# Patient Record
Sex: Female | Born: 1950
Health system: Southern US, Community
[De-identification: ages and names within clinical notes are randomized; demographics above are authoritative.]

## PROBLEM LIST (undated history)

## (undated) DIAGNOSIS — K759 Inflammatory liver disease, unspecified: Secondary | ICD-10-CM

## (undated) DIAGNOSIS — K219 Gastro-esophageal reflux disease without esophagitis: Secondary | ICD-10-CM

## (undated) DIAGNOSIS — D649 Anemia, unspecified: Secondary | ICD-10-CM

## (undated) DIAGNOSIS — Z5189 Encounter for other specified aftercare: Secondary | ICD-10-CM

## (undated) DIAGNOSIS — I10 Essential (primary) hypertension: Secondary | ICD-10-CM

## (undated) HISTORY — DX: Essential (primary) hypertension: I10

## (undated) HISTORY — DX: Anemia, unspecified: D64.9

## (undated) HISTORY — DX: Gastro-esophageal reflux disease without esophagitis: K21.9

## (undated) HISTORY — DX: Encounter for other specified aftercare: Z51.89

## (undated) HISTORY — DX: Inflammatory liver disease, unspecified: K75.9

---
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
## 1998-07-18 ENCOUNTER — Other Ambulatory Visit: Admission: RE | Admit: 1998-07-18 | Discharge: 1998-07-18 | Payer: Self-pay | Admitting: Obstetrics and Gynecology

## 1999-10-03 ENCOUNTER — Other Ambulatory Visit: Admission: RE | Admit: 1999-10-03 | Discharge: 1999-10-03 | Payer: Self-pay | Admitting: Obstetrics and Gynecology

## 2000-05-05 ENCOUNTER — Other Ambulatory Visit: Admission: RE | Admit: 2000-05-05 | Discharge: 2000-05-05 | Payer: Self-pay | Admitting: Obstetrics and Gynecology

## 2002-03-16 ENCOUNTER — Other Ambulatory Visit: Admission: RE | Admit: 2002-03-16 | Discharge: 2002-03-16 | Payer: Self-pay | Admitting: Obstetrics and Gynecology

## 2003-05-10 ENCOUNTER — Other Ambulatory Visit: Admission: RE | Admit: 2003-05-10 | Discharge: 2003-05-10 | Payer: Self-pay | Admitting: Obstetrics and Gynecology

## 2003-12-21 ENCOUNTER — Other Ambulatory Visit: Admission: RE | Admit: 2003-12-21 | Discharge: 2003-12-21 | Payer: Self-pay | Admitting: Orthopaedic Surgery

## 2004-08-23 ENCOUNTER — Other Ambulatory Visit: Admission: RE | Admit: 2004-08-23 | Discharge: 2004-08-23 | Payer: Self-pay | Admitting: Obstetrics and Gynecology

## 2006-09-21 ENCOUNTER — Ambulatory Visit: Payer: Self-pay | Admitting: Internal Medicine

## 2006-09-25 ENCOUNTER — Ambulatory Visit: Payer: Self-pay | Admitting: Internal Medicine

## 2006-09-25 ENCOUNTER — Ambulatory Visit (HOSPITAL_COMMUNITY): Admission: RE | Admit: 2006-09-25 | Discharge: 2006-09-25 | Payer: Self-pay | Admitting: Internal Medicine

## 2006-09-25 HISTORY — PX: COLONOSCOPY: SHX174

## 2006-09-25 HISTORY — PX: UPPER GASTROINTESTINAL ENDOSCOPY: SHX188

## 2007-02-05 ENCOUNTER — Ambulatory Visit: Payer: Self-pay | Admitting: Gastroenterology

## 2007-03-19 ENCOUNTER — Ambulatory Visit (HOSPITAL_COMMUNITY): Admission: RE | Admit: 2007-03-19 | Discharge: 2007-03-19 | Payer: Self-pay | Admitting: Gastroenterology

## 2007-03-19 ENCOUNTER — Encounter (INDEPENDENT_AMBULATORY_CARE_PROVIDER_SITE_OTHER): Payer: Self-pay | Admitting: Specialist

## 2007-03-19 HISTORY — PX: LIVER BIOPSY: SHX301

## 2007-03-23 ENCOUNTER — Ambulatory Visit: Payer: Self-pay | Admitting: Gastroenterology

## 2007-06-22 ENCOUNTER — Ambulatory Visit: Payer: Self-pay | Admitting: Gastroenterology

## 2007-07-22 ENCOUNTER — Ambulatory Visit: Payer: Self-pay | Admitting: Gastroenterology

## 2007-07-29 ENCOUNTER — Ambulatory Visit: Payer: Self-pay | Admitting: Gastroenterology

## 2007-08-11 ENCOUNTER — Ambulatory Visit: Payer: Self-pay | Admitting: Gastroenterology

## 2007-08-26 ENCOUNTER — Ambulatory Visit: Payer: Self-pay | Admitting: Gastroenterology

## 2007-09-09 ENCOUNTER — Ambulatory Visit: Payer: Self-pay | Admitting: Gastroenterology

## 2007-10-07 ENCOUNTER — Ambulatory Visit: Payer: Self-pay | Admitting: Gastroenterology

## 2007-11-09 ENCOUNTER — Ambulatory Visit (HOSPITAL_COMMUNITY): Admission: RE | Admit: 2007-11-09 | Discharge: 2007-11-09 | Payer: Self-pay | Admitting: Gastroenterology

## 2007-12-14 ENCOUNTER — Ambulatory Visit: Payer: Self-pay | Admitting: Gastroenterology

## 2008-01-18 ENCOUNTER — Ambulatory Visit: Payer: Self-pay | Admitting: Gastroenterology

## 2008-02-24 ENCOUNTER — Ambulatory Visit: Payer: Self-pay | Admitting: Gastroenterology

## 2008-03-02 ENCOUNTER — Ambulatory Visit: Payer: Self-pay | Admitting: Gastroenterology

## 2008-03-02 ENCOUNTER — Ambulatory Visit: Payer: Self-pay | Admitting: Oncology

## 2008-03-09 LAB — CBC WITH DIFFERENTIAL (CANCER CENTER ONLY)
EOS%: 1.5 % (ref 0.0–7.0)
LYMPH%: 41.5 % (ref 14.0–48.0)
MCH: 22.6 pg — ABNORMAL LOW (ref 26.0–34.0)
MCHC: 31.5 g/dL — ABNORMAL LOW (ref 32.0–36.0)
MCV: 72 fL — ABNORMAL LOW (ref 81–101)
MONO%: 6.7 % (ref 0.0–13.0)
NEUT#: 2.1 10*3/uL (ref 1.5–6.5)
Platelets: 57 10*3/uL — ABNORMAL LOW (ref 145–400)
RDW: 18.8 % — ABNORMAL HIGH (ref 10.5–14.6)

## 2008-03-09 LAB — MORPHOLOGY - CHCC SATELLITE

## 2008-03-09 LAB — RETICULOCYTES (CHCC)
ABS Retic: 131.1 10*3/uL (ref 19.0–186.0)
Retic Ct Pct: 3 % (ref 0.4–3.1)

## 2008-03-09 LAB — COMPREHENSIVE METABOLIC PANEL
ALT: 44 U/L — ABNORMAL HIGH (ref 0–35)
Albumin: 3.9 g/dL (ref 3.5–5.2)
Alkaline Phosphatase: 49 U/L (ref 39–117)
Glucose, Bld: 96 mg/dL (ref 70–99)
Potassium: 3.7 mEq/L (ref 3.5–5.3)
Sodium: 142 mEq/L (ref 135–145)
Total Protein: 6.9 g/dL (ref 6.0–8.3)

## 2008-03-10 LAB — IRON AND TIBC: Iron: 158 ug/dL — ABNORMAL HIGH (ref 42–145)

## 2008-03-10 LAB — VITAMIN B12: Vitamin B-12: 349 pg/mL (ref 211–911)

## 2008-03-17 ENCOUNTER — Ambulatory Visit (HOSPITAL_COMMUNITY): Admission: RE | Admit: 2008-03-17 | Discharge: 2008-03-17 | Payer: Self-pay | Admitting: Gastroenterology

## 2008-03-29 ENCOUNTER — Encounter: Payer: Self-pay | Admitting: Oncology

## 2008-03-29 ENCOUNTER — Other Ambulatory Visit: Admission: RE | Admit: 2008-03-29 | Discharge: 2008-03-29 | Payer: Self-pay | Admitting: Oncology

## 2008-03-29 LAB — CBC WITH DIFFERENTIAL (CANCER CENTER ONLY)
BASO#: 0 10*3/uL (ref 0.0–0.2)
BASO%: 0.2 % (ref 0.0–2.0)
Eosinophils Absolute: 0 10*3/uL (ref 0.0–0.5)
HCT: 32.5 % — ABNORMAL LOW (ref 34.8–46.6)
HGB: 10.3 g/dL — ABNORMAL LOW (ref 11.6–15.9)
LYMPH#: 1.5 10*3/uL (ref 0.9–3.3)
LYMPH%: 49 % — ABNORMAL HIGH (ref 14.0–48.0)
MCV: 69 fL — ABNORMAL LOW (ref 81–101)
MONO#: 0.3 10*3/uL (ref 0.1–0.9)
NEUT%: 40.7 % (ref 39.6–80.0)
RBC: 4.67 10*6/uL (ref 3.70–5.32)
WBC: 3 10*3/uL — ABNORMAL LOW (ref 3.9–10.0)

## 2008-03-30 ENCOUNTER — Ambulatory Visit: Payer: Self-pay | Admitting: Gastroenterology

## 2008-04-27 ENCOUNTER — Ambulatory Visit: Payer: Self-pay | Admitting: Gastroenterology

## 2008-05-03 ENCOUNTER — Ambulatory Visit: Payer: Self-pay | Admitting: Oncology

## 2008-05-04 LAB — MORPHOLOGY - CHCC SATELLITE

## 2008-05-04 LAB — CBC WITH DIFFERENTIAL (CANCER CENTER ONLY)
BASO%: 1.6 % (ref 0.0–2.0)
Eosinophils Absolute: 0.1 10*3/uL (ref 0.0–0.5)
LYMPH%: 45 % (ref 14.0–48.0)
MCH: 22.1 pg — ABNORMAL LOW (ref 26.0–34.0)
MONO#: 0.2 10*3/uL (ref 0.1–0.9)
MONO%: 6.2 % (ref 0.0–13.0)
NEUT#: 1.5 10*3/uL (ref 1.5–6.5)
Platelets: 85 10*3/uL — ABNORMAL LOW (ref 145–400)
RBC: 4.22 10*6/uL (ref 3.70–5.32)
RDW: 20.8 % — ABNORMAL HIGH (ref 10.5–14.6)
WBC: 3.3 10*3/uL — ABNORMAL LOW (ref 3.9–10.0)

## 2008-05-25 ENCOUNTER — Ambulatory Visit: Payer: Self-pay | Admitting: Gastroenterology

## 2008-06-22 ENCOUNTER — Ambulatory Visit: Payer: Self-pay | Admitting: Gastroenterology

## 2008-07-20 ENCOUNTER — Ambulatory Visit: Payer: Self-pay | Admitting: Gastroenterology

## 2008-07-31 ENCOUNTER — Ambulatory Visit: Payer: Self-pay | Admitting: Oncology

## 2008-08-01 LAB — CBC WITH DIFFERENTIAL (CANCER CENTER ONLY)
BASO%: 0.4 % (ref 0.0–2.0)
Eosinophils Absolute: 0 10*3/uL (ref 0.0–0.5)
LYMPH#: 1.6 10*3/uL (ref 0.9–3.3)
LYMPH%: 45.2 % (ref 14.0–48.0)
MCV: 68 fL — ABNORMAL LOW (ref 81–101)
MONO#: 0.3 10*3/uL (ref 0.1–0.9)
Platelets: 90 10*3/uL — ABNORMAL LOW (ref 145–400)
RBC: 4.7 10*6/uL (ref 3.70–5.32)
RDW: 18.1 % — ABNORMAL HIGH (ref 10.5–14.6)
WBC: 3.6 10*3/uL — ABNORMAL LOW (ref 3.9–10.0)

## 2008-08-17 ENCOUNTER — Ambulatory Visit: Payer: Self-pay | Admitting: Gastroenterology

## 2008-10-04 ENCOUNTER — Ambulatory Visit: Payer: Self-pay | Admitting: Oncology

## 2008-10-05 LAB — CBC WITH DIFFERENTIAL (CANCER CENTER ONLY)
BASO#: 0 10*3/uL (ref 0.0–0.2)
EOS%: 1.8 % (ref 0.0–7.0)
HCT: 34.1 % — ABNORMAL LOW (ref 34.8–46.6)
HGB: 10.9 g/dL — ABNORMAL LOW (ref 11.6–15.9)
LYMPH%: 38 % (ref 14.0–48.0)
MCH: 22.3 pg — ABNORMAL LOW (ref 26.0–34.0)
MCHC: 32 g/dL (ref 32.0–36.0)
MCV: 70 fL — ABNORMAL LOW (ref 81–101)
MONO%: 6.4 % (ref 0.0–13.0)
NEUT#: 2.8 10*3/uL (ref 1.5–6.5)
NEUT%: 53.2 % (ref 39.6–80.0)

## 2008-11-06 ENCOUNTER — Ambulatory Visit (HOSPITAL_COMMUNITY): Admission: RE | Admit: 2008-11-06 | Discharge: 2008-11-06 | Payer: Self-pay | Admitting: Gastroenterology

## 2008-11-23 ENCOUNTER — Ambulatory Visit: Payer: Self-pay | Admitting: Gastroenterology

## 2009-04-03 ENCOUNTER — Ambulatory Visit: Payer: Self-pay | Admitting: Oncology

## 2009-07-20 HISTORY — PX: UPPER GASTROINTESTINAL ENDOSCOPY: SHX188

## 2010-10-04 ENCOUNTER — Ambulatory Visit: Payer: Self-pay | Admitting: Cardiology

## 2011-01-14 ENCOUNTER — Ambulatory Visit (INDEPENDENT_AMBULATORY_CARE_PROVIDER_SITE_OTHER): Payer: BC Managed Care – PPO | Admitting: Internal Medicine

## 2011-01-14 DIAGNOSIS — B182 Chronic viral hepatitis C: Secondary | ICD-10-CM

## 2011-02-17 ENCOUNTER — Ambulatory Visit (INDEPENDENT_AMBULATORY_CARE_PROVIDER_SITE_OTHER): Payer: BC Managed Care – PPO | Admitting: Internal Medicine

## 2011-02-17 DIAGNOSIS — D6189 Other specified aplastic anemias and other bone marrow failure syndromes: Secondary | ICD-10-CM

## 2011-02-17 DIAGNOSIS — R7989 Other specified abnormal findings of blood chemistry: Secondary | ICD-10-CM

## 2011-02-17 DIAGNOSIS — B182 Chronic viral hepatitis C: Secondary | ICD-10-CM

## 2011-03-21 ENCOUNTER — Other Ambulatory Visit (INDEPENDENT_AMBULATORY_CARE_PROVIDER_SITE_OTHER): Payer: Self-pay | Admitting: Internal Medicine

## 2011-03-21 ENCOUNTER — Encounter (HOSPITAL_COMMUNITY): Payer: BC Managed Care – PPO | Attending: Internal Medicine

## 2011-03-21 DIAGNOSIS — B182 Chronic viral hepatitis C: Secondary | ICD-10-CM | POA: Insufficient documentation

## 2011-03-21 DIAGNOSIS — D563 Thalassemia minor: Secondary | ICD-10-CM | POA: Insufficient documentation

## 2011-03-24 ENCOUNTER — Encounter (HOSPITAL_COMMUNITY): Payer: BC Managed Care – PPO | Attending: Internal Medicine

## 2011-03-24 DIAGNOSIS — B182 Chronic viral hepatitis C: Secondary | ICD-10-CM | POA: Insufficient documentation

## 2011-03-24 DIAGNOSIS — D563 Thalassemia minor: Secondary | ICD-10-CM | POA: Insufficient documentation

## 2011-03-25 LAB — CROSSMATCH
Antibody Screen: NEGATIVE
Unit division: 0

## 2011-04-01 ENCOUNTER — Ambulatory Visit (INDEPENDENT_AMBULATORY_CARE_PROVIDER_SITE_OTHER): Payer: BC Managed Care – PPO | Admitting: Internal Medicine

## 2011-04-01 DIAGNOSIS — B182 Chronic viral hepatitis C: Secondary | ICD-10-CM

## 2011-04-01 DIAGNOSIS — D509 Iron deficiency anemia, unspecified: Secondary | ICD-10-CM

## 2011-04-14 ENCOUNTER — Encounter (HOSPITAL_COMMUNITY): Payer: BC Managed Care – PPO | Attending: Internal Medicine

## 2011-04-14 DIAGNOSIS — D563 Thalassemia minor: Secondary | ICD-10-CM | POA: Insufficient documentation

## 2011-04-14 DIAGNOSIS — B182 Chronic viral hepatitis C: Secondary | ICD-10-CM | POA: Insufficient documentation

## 2011-04-21 ENCOUNTER — Encounter (HOSPITAL_COMMUNITY): Payer: BC Managed Care – PPO

## 2011-04-25 NOTE — Op Note (Signed)
[name]              ACCOUNT NO.:  [id number]   MEDICAL RECORD NO.:  [id number]          PATIENT TYPE:  AMB   LOCATION:  DAY                           FACILITY:  [facility]   PHYSICIAN:  R. Roetta Sessions, M.D. DATE OF BIRTH:  25-Nov-1951   DATE OF PROCEDURE:  09/25/2006  DATE OF DISCHARGE:  09/25/2006                                 OPERATIVE REPORT   PROCEDURE:  EGD followed by screening colonoscopy.   INDICATIONS FOR PROCEDURE:  The patient is a 60 year old lady with  longstanding gastroesophageal reflux disease symptoms with no prior imaging  of her esophagus.  She is not on any acid suppression therapy.  She is also  in need of colorectal cancer screening.  She has no family history of  colorectal neoplasia.  She has never had her colon imaged.  She has no lower  GI tract symptoms.  Colonoscopy is now being done as a screening maneuver,  the EGD is now being done for diagnostic purposes.  This approach has been  discussed with the patient at length.  The potential risks, benefits and  alternatives have been reviewed, questions answered, she is agreeable,  please see documentation in medical record.   PROCEDURE NOTE:  The O2 saturation, blood pressure, pulse respirations were  monitored throughout the entire procedure.   CONSCIOUS SEDATION:  1. Versed 6 mg IV.  2. Demerol 100 mg IV in divided doses for both procedures.  3. Cetacaine spray for topical oropharyngeal anesthesia.   INSTRUMENT:  Olympus video chip system.   FINDINGS:  __________ examination of tubular esophagus revealed no mucosal  abnormalities, EG junction easily traversed.  The remaining stomach, colon  and gastric cavity was empty and insufflated well with air.  A thorough  examination of gastric mucosa including retroflexion via the proximal  stomach, esophagogastric junction demonstrated only a small hiatal hernia.  There was no stigmata of chronic liver disease including esophageal varices  or  portal gastropathy.  The pylorus was patent and easily traversed.  Examination of the bulb and second portion revealed no abnormalities.   THERAPEUTIC DIAGNOSTIC MANEUVERS PERFORMED:  None.   The patient tolerated the procedure well and was prepared for colonoscopy.  Digital rectal exam revealed no abnormalities.   ENDOSCOPIC FINDINGS:  Prep was good.   RECTUM:  Examination of the rectal mucosa, including retroflexed view of the  anal verge, revealed no abnormalities.   COLON:  Colonic mucosa was surveyed from the rectosigmoid junction through  the left transverse, right colon  to the appendiceal orifice, ileocecal  valve and cecum.  These structures were well seen and photographed for the  record.  From this level the scope was cautiously withdrawn.  All previously  mentioned mucosal surfaces were again seen.  The colonic mucosa appeared  normal.  The patient tolerated both procedures well, was reactive to  endoscopy.   IMPRESSION:  1. Esophagogastroduodenoscopy normal esophagus, small hiatal hernia      otherwise normal stomach D1, D2.  2. Colonoscopy findings normal rectum and colon.   RECOMMENDATIONS:  1. Repeat screening colonoscopy 10 years.  2. Antireflux  literature provided to [name].  3. Begin Aciphex 20 mg orally daily, she is to go by office for free      samples.   As a separate issue, she has transaminitis.  Her Hepatitis C ELISA antibody  is positive.  Through my office we ordered a Hepatitis C RNA quantitative  assay.  The assay has come back positive with __________ RNA long at 5.93 or  858,000 international units per mL.  With this information (genotype is  pending), will take the liberty of making her an appointment to see the [facility]  folks down at the [facility] for further evaluation and treatment of  chronic Hepatitis C infection.      Jonathon Bellows, M.D.  Electronically Signed     RMR/MEDQ  D:  09/25/2006  T:  09/27/2006  Job:   [id number]   cc:   Selinda Flavin  Fax: [phone]

## 2011-04-28 ENCOUNTER — Encounter (HOSPITAL_COMMUNITY): Payer: BC Managed Care – PPO

## 2011-04-29 ENCOUNTER — Ambulatory Visit (INDEPENDENT_AMBULATORY_CARE_PROVIDER_SITE_OTHER): Payer: BC Managed Care – PPO | Admitting: Internal Medicine

## 2011-05-06 ENCOUNTER — Encounter (HOSPITAL_COMMUNITY): Payer: BC Managed Care – PPO

## 2011-05-16 ENCOUNTER — Other Ambulatory Visit (INDEPENDENT_AMBULATORY_CARE_PROVIDER_SITE_OTHER): Payer: Self-pay | Admitting: Internal Medicine

## 2011-05-16 ENCOUNTER — Encounter (HOSPITAL_COMMUNITY): Payer: BC Managed Care – PPO | Attending: Internal Medicine

## 2011-05-16 DIAGNOSIS — B182 Chronic viral hepatitis C: Secondary | ICD-10-CM | POA: Insufficient documentation

## 2011-05-16 DIAGNOSIS — D563 Thalassemia minor: Secondary | ICD-10-CM | POA: Insufficient documentation

## 2011-05-19 ENCOUNTER — Encounter (HOSPITAL_COMMUNITY): Payer: BC Managed Care – PPO

## 2011-05-20 LAB — CROSSMATCH
Antibody Screen: NEGATIVE
Unit division: 0

## 2011-06-13 ENCOUNTER — Other Ambulatory Visit (HOSPITAL_COMMUNITY): Payer: Self-pay | Admitting: Oncology

## 2011-06-13 ENCOUNTER — Other Ambulatory Visit (INDEPENDENT_AMBULATORY_CARE_PROVIDER_SITE_OTHER): Payer: Self-pay | Admitting: Internal Medicine

## 2011-06-13 ENCOUNTER — Encounter (HOSPITAL_COMMUNITY): Payer: BC Managed Care – PPO | Attending: Internal Medicine

## 2011-06-13 DIAGNOSIS — D563 Thalassemia minor: Secondary | ICD-10-CM | POA: Insufficient documentation

## 2011-06-13 DIAGNOSIS — B182 Chronic viral hepatitis C: Secondary | ICD-10-CM | POA: Insufficient documentation

## 2011-06-13 LAB — HEMOGLOBIN AND HEMATOCRIT, BLOOD: HCT: 27 % — ABNORMAL LOW (ref 36.0–46.0)

## 2011-06-14 ENCOUNTER — Encounter (HOSPITAL_COMMUNITY): Payer: Self-pay | Admitting: Oncology

## 2011-06-16 ENCOUNTER — Encounter (HOSPITAL_COMMUNITY): Payer: BC Managed Care – PPO

## 2011-06-16 DIAGNOSIS — D563 Thalassemia minor: Secondary | ICD-10-CM | POA: Insufficient documentation

## 2011-06-16 DIAGNOSIS — B182 Chronic viral hepatitis C: Secondary | ICD-10-CM | POA: Insufficient documentation

## 2011-06-16 MED ORDER — SODIUM CHLORIDE 0.9 % IJ SOLN
INTRAMUSCULAR | Status: AC
  Filled 2011-06-16: qty 10

## 2011-06-17 LAB — CROSSMATCH
Antibody Screen: NEGATIVE
Unit division: 0

## 2011-06-24 ENCOUNTER — Ambulatory Visit (INDEPENDENT_AMBULATORY_CARE_PROVIDER_SITE_OTHER): Payer: BC Managed Care – PPO | Admitting: Internal Medicine

## 2011-06-24 DIAGNOSIS — D649 Anemia, unspecified: Secondary | ICD-10-CM

## 2011-06-24 DIAGNOSIS — B182 Chronic viral hepatitis C: Secondary | ICD-10-CM

## 2011-07-09 ENCOUNTER — Other Ambulatory Visit (INDEPENDENT_AMBULATORY_CARE_PROVIDER_SITE_OTHER): Payer: Self-pay | Admitting: Internal Medicine

## 2011-07-10 LAB — CBC WITH DIFFERENTIAL/PLATELET
Basophils Relative: 0 % (ref 0–1)
Eosinophils Relative: 0 % (ref 0–5)
HCT: 29.2 % — ABNORMAL LOW (ref 36.0–46.0)
Hemoglobin: 8.3 g/dL — ABNORMAL LOW (ref 12.0–15.0)
Lymphs Abs: 1.8 10*3/uL (ref 0.7–4.0)
Neutrophils Relative %: 43 % (ref 43–77)
Platelets: 61 10*3/uL — ABNORMAL LOW (ref 150–400)

## 2011-07-15 ENCOUNTER — Encounter (HOSPITAL_COMMUNITY): Payer: BC Managed Care – PPO | Attending: Internal Medicine

## 2011-07-15 DIAGNOSIS — D649 Anemia, unspecified: Secondary | ICD-10-CM | POA: Insufficient documentation

## 2011-07-16 ENCOUNTER — Encounter (HOSPITAL_COMMUNITY): Payer: BC Managed Care – PPO

## 2011-07-17 LAB — TYPE AND SCREEN
ABO/RH(D): O POS
Unit division: 0
Unit division: 0

## 2011-07-21 ENCOUNTER — Encounter (INDEPENDENT_AMBULATORY_CARE_PROVIDER_SITE_OTHER): Payer: Self-pay

## 2011-07-23 ENCOUNTER — Telehealth (INDEPENDENT_AMBULATORY_CARE_PROVIDER_SITE_OTHER): Payer: Self-pay | Admitting: *Deleted

## 2011-07-23 DIAGNOSIS — B1920 Unspecified viral hepatitis C without hepatic coma: Secondary | ICD-10-CM

## 2011-07-23 DIAGNOSIS — D649 Anemia, unspecified: Secondary | ICD-10-CM

## 2011-07-23 NOTE — Telephone Encounter (Signed)
This is a CBC/D due post recent Blood Transfusion 07-16-11

## 2011-07-25 LAB — CBC WITH DIFFERENTIAL/PLATELET
Basophils Absolute: 0 10*3/uL (ref 0.0–0.1)
HCT: 35.9 % — ABNORMAL LOW (ref 36.0–46.0)
Lymphocytes Relative: 44 % (ref 12–46)
Neutro Abs: 1.7 10*3/uL (ref 1.7–7.7)
Platelets: 55 10*3/uL — ABNORMAL LOW (ref 150–400)
RDW: 20.1 % — ABNORMAL HIGH (ref 11.5–15.5)
WBC: 3.8 10*3/uL — ABNORMAL LOW (ref 4.0–10.5)

## 2011-07-28 ENCOUNTER — Telehealth (INDEPENDENT_AMBULATORY_CARE_PROVIDER_SITE_OTHER): Payer: Self-pay | Admitting: *Deleted

## 2011-07-28 DIAGNOSIS — B1920 Unspecified viral hepatitis C without hepatic coma: Secondary | ICD-10-CM

## 2011-07-28 DIAGNOSIS — D649 Anemia, unspecified: Secondary | ICD-10-CM

## 2011-07-28 NOTE — Telephone Encounter (Signed)
The Patient was called and a message was given with the results per Dr. Karilyn Cota. Her next CBC/D is due on 08-08-11.

## 2011-08-08 LAB — CBC WITH DIFFERENTIAL/PLATELET
Hemoglobin: 8.7 g/dL — ABNORMAL LOW (ref 12.0–15.0)
Lymphs Abs: 1.6 10*3/uL (ref 0.7–4.0)
Monocytes Relative: 10 % (ref 3–12)
Neutro Abs: 2 10*3/uL (ref 1.7–7.7)
Neutrophils Relative %: 49 % (ref 43–77)
RBC: 3.89 MIL/uL (ref 3.87–5.11)

## 2011-08-12 ENCOUNTER — Telehealth (INDEPENDENT_AMBULATORY_CARE_PROVIDER_SITE_OTHER): Payer: Self-pay | Admitting: *Deleted

## 2011-08-12 DIAGNOSIS — B1920 Unspecified viral hepatitis C without hepatic coma: Secondary | ICD-10-CM

## 2011-08-12 DIAGNOSIS — D649 Anemia, unspecified: Secondary | ICD-10-CM

## 2011-08-12 NOTE — Telephone Encounter (Signed)
The Patient should have drawn week of September 10 th 2012.

## 2011-08-21 LAB — CBC WITH DIFFERENTIAL/PLATELET
Basophils Relative: 0 % (ref 0–1)
Eosinophils Relative: 1 % (ref 0–5)
HCT: 30.7 % — ABNORMAL LOW (ref 36.0–46.0)
Hemoglobin: 8.7 g/dL — ABNORMAL LOW (ref 12.0–15.0)
MCH: 22.6 pg — ABNORMAL LOW (ref 26.0–34.0)
MCHC: 28.3 g/dL — ABNORMAL LOW (ref 30.0–36.0)
MCV: 79.7 fL (ref 78.0–100.0)
Monocytes Absolute: 0.4 10*3/uL (ref 0.1–1.0)
Monocytes Relative: 9 % (ref 3–12)
Neutro Abs: 1.8 10*3/uL (ref 1.7–7.7)
RDW: 22.2 % — ABNORMAL HIGH (ref 11.5–15.5)

## 2011-09-02 ENCOUNTER — Telehealth (INDEPENDENT_AMBULATORY_CARE_PROVIDER_SITE_OTHER): Payer: Self-pay | Admitting: *Deleted

## 2011-09-02 LAB — BONE MARROW EXAM

## 2011-09-02 NOTE — Telephone Encounter (Signed)
Wants to make sure her lab order has been sent.  She is going tomorrow and has a return appt Tuesday.

## 2011-09-03 NOTE — Telephone Encounter (Signed)
Patient was to have labs and lab order was faxed previous.

## 2011-09-04 ENCOUNTER — Telehealth (INDEPENDENT_AMBULATORY_CARE_PROVIDER_SITE_OTHER): Payer: Self-pay | Admitting: *Deleted

## 2011-09-04 DIAGNOSIS — D649 Anemia, unspecified: Secondary | ICD-10-CM

## 2011-09-04 DIAGNOSIS — B1920 Unspecified viral hepatitis C without hepatic coma: Secondary | ICD-10-CM

## 2011-09-04 NOTE — Telephone Encounter (Signed)
Per Dr. Karilyn Cota on 08-22-11 the patient will need a CBC/D in 2 weeks. Order faxed to [facility] in [place]. The patient has an appointment 09-09-11.

## 2011-09-05 LAB — CBC WITH DIFFERENTIAL/PLATELET
Hemoglobin: 8.4 g/dL — ABNORMAL LOW (ref 12.0–15.0)
Lymphocytes Relative: 37 % (ref 12–46)
Lymphs Abs: 1.6 10*3/uL (ref 0.7–4.0)
MCH: 22.3 pg — ABNORMAL LOW (ref 26.0–34.0)
Monocytes Relative: 11 % (ref 3–12)
Neutro Abs: 2.3 10*3/uL (ref 1.7–7.7)
Neutrophils Relative %: 51 % (ref 43–77)
RBC: 3.77 MIL/uL — ABNORMAL LOW (ref 3.87–5.11)
WBC: 4.4 10*3/uL (ref 4.0–10.5)

## 2011-09-09 ENCOUNTER — Ambulatory Visit (INDEPENDENT_AMBULATORY_CARE_PROVIDER_SITE_OTHER): Payer: BC Managed Care – PPO | Admitting: Internal Medicine

## 2011-09-09 ENCOUNTER — Encounter (INDEPENDENT_AMBULATORY_CARE_PROVIDER_SITE_OTHER): Payer: Self-pay | Admitting: Internal Medicine

## 2011-09-09 DIAGNOSIS — B182 Chronic viral hepatitis C: Secondary | ICD-10-CM

## 2011-09-09 DIAGNOSIS — D649 Anemia, unspecified: Secondary | ICD-10-CM

## 2011-09-09 LAB — CREATININE, SERUM
Creatinine, Ser: 0.68
GFR calc Af Amer: >60
GFR calc non Af Amer: >60

## 2011-09-09 LAB — BUN: BUN: 13

## 2011-09-09 NOTE — Patient Instructions (Signed)
Transfusion with 1 unit of PRBCs to be arranged. Next self blood work would be on finishing 36 weeks of therapy.

## 2011-09-09 NOTE — Progress Notes (Signed)
Presenting complaint; followup for hepatitis C and anemia Subjective; patient is 60 year old Caucasian female who has chronic hepatitis C genotype 1 stage III fibrosis was completed 34 weeks of antiviral therapy. She is currently on Pegasys and ribavirin. She also received 12 weeks of telaprevir. She has thalassemia minor. Heart treatment has been complicated by anemia. She has received 6 units of PRBCs. Her CBC last week and her hemoglobin was down to 8.4.  She can tell her hemoglobin has dropped because she has no energy. She also complains of headache. He develops headache every time her hemoglobin drops. She states that she's not working she just sleeping does not feel depressed. Her appetite is normal and she has not lost any weight. Current medications Current Outpatient Prescriptions on File Prior to Visit  Medication Sig Dispense Refill  . calcium citrate-vitamin D (CALCIUM + D) 315-200 MG-UNIT per tablet Take by mouth 2 (two) times daily.        Marland Kitchen epoetin alfa (PROCRIT) 91478 UNIT/ML injection Inject 10,000 Units into the skin once a week.       . metoprolol (TOPROL-XL) 50 MG 24 hr tablet Take 50 mg by mouth daily.        Marland Kitchen omeprazole (PRILOSEC) 20 MG capsule Take 20 mg by mouth as needed.       . peginterferon alfa-2a (PEGASYS) 180 MCG/ML injection Inject 180 mcg into the skin every 7 (seven) days.        . ribavirin (RIBATAB) 400 MG tablet Take by mouth 2 (two) times daily.         objective BP 140/84  Pulse 74  Temp(Src) 99 F (37.2 C) (Oral)  Resp 14  Ht 5\' 4"  (1.626 m)  Wt 139 lb (63.05 kg)  BMI 23.86 kg/m2 Conjunctiva is pale. Sclerae nonicteric. Oropharyngeal mucosa is normal no neck masses  noted Lungs are clear to auscultation Abdomen is soft and nontender been tip is palpable. Liver edge is indistinct. No peripheral edema noted. Lab data From 09/04/2011 WBC 4.4, hemoglobin 8.4, hematocrit 29 platelet count 97k Assessment Patient has completed 34 weeks of antiviral therapy  for hepatitis C; her hemoglobin is down again and she is quite symptomatic; goal is to get her to 36 weeks of therapy; if anemia was not an issue I would be inclined to treat her for 48 weeks I believe 36 weeks would be reasonable given present circumstances. Will give another unit of PRBCs so she can continue therapy for 2 more weeks. This would be the seventh units of PRBC given to her while on treatment Plan Transfuse 1 unit of PRBCs She'll have HCVRNA, LFTs and TSH at [facility]. We'll repeat her CBC in 6 weeks Office visit in 4 months with HCVRNA titer as well as CBC and LFTs.

## 2011-09-11 ENCOUNTER — Telehealth (INDEPENDENT_AMBULATORY_CARE_PROVIDER_SITE_OTHER): Payer: Self-pay | Admitting: *Deleted

## 2011-09-11 ENCOUNTER — Encounter (HOSPITAL_COMMUNITY): Payer: BC Managed Care – PPO | Attending: Internal Medicine

## 2011-09-11 DIAGNOSIS — D649 Anemia, unspecified: Secondary | ICD-10-CM | POA: Insufficient documentation

## 2011-09-11 LAB — HEMOGLOBIN AND HEMATOCRIT, BLOOD: Hemoglobin: 9 g/dL — ABNORMAL LOW (ref 12.0–15.0)

## 2011-09-11 NOTE — Progress Notes (Signed)
Labs drawn today for H&H, cross match for 10/5

## 2011-09-11 NOTE — Telephone Encounter (Signed)
They have called again about her medication for her treatment.  [phone]

## 2011-09-12 ENCOUNTER — Encounter (HOSPITAL_COMMUNITY): Payer: BC Managed Care – PPO

## 2011-09-12 VITALS — BP 128/69 | HR 67 | Temp 98.3°F | Resp 16

## 2011-09-12 DIAGNOSIS — D649 Anemia, unspecified: Secondary | ICD-10-CM

## 2011-09-12 MED ORDER — SODIUM CHLORIDE 0.9 % IV SOLN: INTRAVENOUS | Status: AC

## 2011-09-12 MED ORDER — SODIUM CHLORIDE 0.9 % IJ SOLN
INTRAMUSCULAR | Status: AC
  Filled 2011-09-12: qty 20

## 2011-09-12 NOTE — Telephone Encounter (Signed)
We have a Rx here to be completed. I will leave if for Dr. Karilyn Cota to sign and get it faxed 09-15-11. Patient will be notified.

## 2011-09-13 LAB — TYPE AND SCREEN: Unit division: 0

## 2011-09-15 ENCOUNTER — Telehealth (INDEPENDENT_AMBULATORY_CARE_PROVIDER_SITE_OTHER): Payer: Self-pay | Admitting: *Deleted

## 2011-09-15 NOTE — Telephone Encounter (Signed)
Please call [phone] on [facility] Option 1 and then Option 3  They have question about her refills

## 2011-09-17 NOTE — Telephone Encounter (Signed)
I called the pharmacy and I was told that they had rec'd the hard copy from our office and was just awaiting the patient to call and arrange shipping. They have left a message for her and I also called and left Malone a message with that information.

## 2011-09-18 ENCOUNTER — Telehealth (INDEPENDENT_AMBULATORY_CARE_PROVIDER_SITE_OTHER): Payer: Self-pay | Admitting: *Deleted

## 2011-09-18 DIAGNOSIS — B1920 Unspecified viral hepatitis C without hepatic coma: Secondary | ICD-10-CM

## 2011-09-18 DIAGNOSIS — D649 Anemia, unspecified: Secondary | ICD-10-CM

## 2011-09-18 NOTE — Telephone Encounter (Signed)
Got your message and will get her labs on 10/13/2011  You do not need to call her unless you need to

## 2011-09-18 NOTE — Telephone Encounter (Signed)
These labs are considered the end of treatment for the patient,36 weeks.

## 2011-09-23 ENCOUNTER — Encounter (HOSPITAL_COMMUNITY): Payer: Self-pay

## 2011-10-15 ENCOUNTER — Telehealth (INDEPENDENT_AMBULATORY_CARE_PROVIDER_SITE_OTHER): Payer: Self-pay | Admitting: *Deleted

## 2011-10-15 ENCOUNTER — Encounter (INDEPENDENT_AMBULATORY_CARE_PROVIDER_SITE_OTHER): Payer: Self-pay | Admitting: *Deleted

## 2011-10-15 ENCOUNTER — Other Ambulatory Visit (INDEPENDENT_AMBULATORY_CARE_PROVIDER_SITE_OTHER): Payer: Self-pay | Admitting: Internal Medicine

## 2011-10-15 NOTE — Telephone Encounter (Signed)
Message copied by Shona Needles on Wed Oct 15, 2011  8:36 AM ------      Message from: Shona Needles      Created: Thu Sep 18, 2011 11:51 AM      Regarding: Lab released       I need to release the lab orders on 10-10-11 as the patient is going on 10-13-11 to [facility] in [place].

## 2011-10-15 NOTE — Telephone Encounter (Signed)
Lab order faxed.

## 2011-10-22 LAB — CBC WITH DIFFERENTIAL/PLATELET
Basophils Absolute: 0 10*3/uL (ref 0.0–0.1)
Basophils Relative: 0 % (ref 0–1)
Eosinophils Relative: 2 % (ref 0–5)
Lymphocytes Relative: 40 % (ref 12–46)
MCHC: 29.9 g/dL — ABNORMAL LOW (ref 30.0–36.0)
Neutro Abs: 2.5 10*3/uL (ref 1.7–7.7)
Platelets: 125 10*3/uL — ABNORMAL LOW (ref 150–400)
RDW: 19.2 % — ABNORMAL HIGH (ref 11.5–15.5)
WBC: 4.8 10*3/uL (ref 4.0–10.5)

## 2011-10-22 LAB — TSH: TSH: 3.587 u[IU]/mL (ref 0.350–4.500)

## 2011-10-22 LAB — ALT: ALT: 14 U/L (ref 0–35)

## 2011-10-27 ENCOUNTER — Telehealth (INDEPENDENT_AMBULATORY_CARE_PROVIDER_SITE_OTHER): Payer: Self-pay | Admitting: *Deleted

## 2011-10-27 DIAGNOSIS — B1920 Unspecified viral hepatitis C without hepatic coma: Secondary | ICD-10-CM

## 2011-10-27 NOTE — Telephone Encounter (Signed)
These labs are per Dr. Karilyn Cota. They are due in 2months, Patient will be sent a reminder,order to be faxed to [facility] in [place].

## 2011-10-27 NOTE — Telephone Encounter (Signed)
This set of labs are due in 4 months. This will be the patient's end of treatment labs. Patient will be sent a reminder and orders will be faxed to [facility] in [place].

## 2011-12-04 ENCOUNTER — Encounter (INDEPENDENT_AMBULATORY_CARE_PROVIDER_SITE_OTHER): Payer: Self-pay | Admitting: *Deleted

## 2011-12-05 ENCOUNTER — Encounter (INDEPENDENT_AMBULATORY_CARE_PROVIDER_SITE_OTHER): Payer: Self-pay | Admitting: *Deleted

## 2011-12-30 ENCOUNTER — Other Ambulatory Visit (INDEPENDENT_AMBULATORY_CARE_PROVIDER_SITE_OTHER): Payer: Self-pay | Admitting: Internal Medicine

## 2011-12-31 LAB — CBC WITH DIFFERENTIAL/PLATELET
Eosinophils Absolute: 0.1 10*3/uL (ref 0.0–0.7)
Eosinophils Relative: 2 % (ref 0–5)
HCT: 39.1 % (ref 36.0–46.0)
Lymphocytes Relative: 32 % (ref 12–46)
Lymphs Abs: 2.1 10*3/uL (ref 0.7–4.0)
MCH: 22.1 pg — ABNORMAL LOW (ref 26.0–34.0)
MCV: 70.3 fL — ABNORMAL LOW (ref 78.0–100.0)
Monocytes Absolute: 0.5 10*3/uL (ref 0.1–1.0)
Platelets: 125 10*3/uL — ABNORMAL LOW (ref 150–400)
RBC: 5.56 MIL/uL — ABNORMAL HIGH (ref 3.87–5.11)
WBC: 6.7 10*3/uL (ref 4.0–10.5)

## 2012-01-02 LAB — HEPATITIS C RNA QUANTITATIVE

## 2012-01-12 ENCOUNTER — Ambulatory Visit (INDEPENDENT_AMBULATORY_CARE_PROVIDER_SITE_OTHER): Payer: BC Managed Care – PPO | Admitting: Internal Medicine

## 2012-01-13 ENCOUNTER — Ambulatory Visit (INDEPENDENT_AMBULATORY_CARE_PROVIDER_SITE_OTHER): Payer: BC Managed Care – PPO | Admitting: Internal Medicine

## 2012-02-02 ENCOUNTER — Other Ambulatory Visit (INDEPENDENT_AMBULATORY_CARE_PROVIDER_SITE_OTHER): Payer: Self-pay | Admitting: *Deleted

## 2012-02-02 MED ORDER — OMEPRAZOLE 20 MG PO CPDR: 20.0000 mg | DELAYED_RELEASE_CAPSULE | Freq: Every day | ORAL | Status: DC

## 2012-02-02 NOTE — Telephone Encounter (Signed)
Patient has called [facility] and requested a refill on her Omeprazole DR 20 mg capsule. Take one capsule by mouth every morning.

## 2012-02-05 ENCOUNTER — Encounter (INDEPENDENT_AMBULATORY_CARE_PROVIDER_SITE_OTHER): Payer: Self-pay | Admitting: *Deleted

## 2012-02-05 ENCOUNTER — Telehealth (INDEPENDENT_AMBULATORY_CARE_PROVIDER_SITE_OTHER): Payer: Self-pay | Admitting: *Deleted

## 2012-02-05 NOTE — Telephone Encounter (Signed)
Opened in error

## 2012-02-16 ENCOUNTER — Encounter (INDEPENDENT_AMBULATORY_CARE_PROVIDER_SITE_OTHER): Payer: Self-pay | Admitting: Internal Medicine

## 2012-02-16 ENCOUNTER — Ambulatory Visit (INDEPENDENT_AMBULATORY_CARE_PROVIDER_SITE_OTHER): Payer: BC Managed Care – PPO | Admitting: Internal Medicine

## 2012-02-16 VITALS — BP 140/96 | HR 80 | Temp 98.1°F | Resp 12 | Ht 64.0 in | Wt 195.0 lb

## 2012-02-16 DIAGNOSIS — B182 Chronic viral hepatitis C: Secondary | ICD-10-CM

## 2012-02-16 DIAGNOSIS — I10 Essential (primary) hypertension: Secondary | ICD-10-CM | POA: Insufficient documentation

## 2012-02-16 DIAGNOSIS — K219 Gastro-esophageal reflux disease without esophagitis: Secondary | ICD-10-CM | POA: Insufficient documentation

## 2012-02-16 DIAGNOSIS — D649 Anemia, unspecified: Secondary | ICD-10-CM

## 2012-02-16 NOTE — Progress Notes (Signed)
Presenting complaint;  Followup for chronic hepatitis C. Patient completed antiviral therapy for months ago. Subjective:  Patient is a 61 year old Caucasian female who is history of chronic hepatitis C genotype 1 stage III fibrosis who completed 6 weeks of antiviral therapy in mid-October 2012. Treatment consisted of 12 weeks of Telaprevir and 36 weeks of Pegasys and ribavirin. Patient states she feels much better. She says all of the side effects that she was experiencing therapy have resolved except on Sunday she feels tired. Her appetite is very good. She denies abdominal pain nausea or vomiting.    Current Medications: Current Outpatient Prescriptions  Medication Sig Dispense Refill  . calcium citrate-vitamin D (CALCIUM + D) 315-200 MG-UNIT per tablet Take by mouth 2 (two) times daily.        . metoprolol (TOPROL-XL) 50 MG 24 hr tablet Take 50 mg by mouth daily.        Marland Kitchen omeprazole (PRILOSEC) 20 MG capsule Take 20 mg by mouth as needed.       Marland Kitchen omeprazole (PRILOSEC) 20 MG capsule Take 1 capsule (20 mg total) by mouth daily.  20 capsule  4   No current facility-administered medications for this visit.   Facility-Administered Medications Ordered in Other Visits  Medication Dose Route Frequency Provider Last Rate Last Dose  . 0.9 %  sodium chloride infusion   Intravenous Continuous Malissa Hippo, MD         Objective: Blood pressure 140/96, pulse 80, temperature 98.1 F (36.7 C), temperature source Oral, resp. rate 12, height 5\' 4"  (1.626 m), weight 195 lb (88.451 kg).  Conjunctiva is pink. Sclera is nonicteric Oropharyngeal mucosa is normal. No neck masses or thyromegaly noted. Cardiac exam with regular rhythm normal S1 and S2. No murmur or gallop noted. Lungs are clear to auscultation. Abdomen is full but soft and nontender without organomegaly or masses.  No LE edema or clubbing noted.  Labs/studies Results: Lab data from  12/30/2011. WBC 6.7, hemoglobin 12.3,  hematocrit 39.1 platelet count 125K TSH 3.798 HCVRNA undetectable     Assessment:  Patient completed 36 weeks of antibiotic therapy consisting of Pegasys and ribavirin for [redacted] weeks along with 12 weeks telaprevir. HCV RNA 3 months after stopping treatment is negative which is reassuring. He'll be rechecked next month which would be 6 months since she completed treatment. Treatment was complicated by profound anemia requiring 8 units of PRBCs in addition to erythropoietin. This was felt to be due to underlying thalassemia minor. Her H&H now is normal but MCV is low.    Plan:  CBC and HCVRNA next month. She would also have LFTs with her next blood draw. Can take OTC iron one pill daily.

## 2012-02-16 NOTE — Patient Instructions (Addendum)
Physician will contact you with results of blood work when completed. Labs noted for April17 th,2013

## 2012-02-27 ENCOUNTER — Encounter (INDEPENDENT_AMBULATORY_CARE_PROVIDER_SITE_OTHER): Payer: Self-pay | Admitting: *Deleted

## 2012-03-23 ENCOUNTER — Other Ambulatory Visit (INDEPENDENT_AMBULATORY_CARE_PROVIDER_SITE_OTHER): Payer: Self-pay | Admitting: Internal Medicine

## 2012-03-24 LAB — HEPATIC FUNCTION PANEL
AST: 23 U/L (ref 0–37)
Albumin: 4.3 g/dL (ref 3.5–5.2)
Alkaline Phosphatase: 70 U/L (ref 39–117)
Bilirubin, Direct: 0.2 mg/dL (ref 0.0–0.3)
Total Bilirubin: 0.6 mg/dL (ref 0.3–1.2)

## 2012-03-24 LAB — CBC WITH DIFFERENTIAL/PLATELET
Eosinophils Absolute: 0.2 10*3/uL (ref 0.0–0.7)
Hemoglobin: 12 g/dL (ref 12.0–15.0)
Lymphocytes Relative: 35 % (ref 12–46)
Lymphs Abs: 2.4 10*3/uL (ref 0.7–4.0)
MCH: 20.9 pg — ABNORMAL LOW (ref 26.0–34.0)
Monocytes Relative: 3 % (ref 3–12)
Neutro Abs: 4 10*3/uL (ref 1.7–7.7)
Neutrophils Relative %: 59 % (ref 43–77)
Platelets: 122 10*3/uL — ABNORMAL LOW (ref 150–400)
RBC: 5.73 MIL/uL — ABNORMAL HIGH (ref 3.87–5.11)
WBC: 6.8 10*3/uL (ref 4.0–10.5)

## 2012-03-26 LAB — HEPATITIS C RNA QUANTITATIVE: HCV Quantitative: NOT DETECTED IU/mL (ref <43)

## 2012-09-22 ENCOUNTER — Telehealth (INDEPENDENT_AMBULATORY_CARE_PROVIDER_SITE_OTHER): Payer: Self-pay | Admitting: *Deleted

## 2012-09-22 NOTE — Telephone Encounter (Signed)
[facility] requesting a refill on Omeprazole DR 20 mg, take one capsule by mouth every morning.

## 2012-11-03 ENCOUNTER — Encounter (INDEPENDENT_AMBULATORY_CARE_PROVIDER_SITE_OTHER): Payer: Self-pay | Admitting: *Deleted

## 2012-12-28 ENCOUNTER — Telehealth (INDEPENDENT_AMBULATORY_CARE_PROVIDER_SITE_OTHER): Payer: Self-pay | Admitting: *Deleted

## 2012-12-28 ENCOUNTER — Other Ambulatory Visit (INDEPENDENT_AMBULATORY_CARE_PROVIDER_SITE_OTHER): Payer: Self-pay | Admitting: Internal Medicine

## 2012-12-28 ENCOUNTER — Ambulatory Visit (INDEPENDENT_AMBULATORY_CARE_PROVIDER_SITE_OTHER): Payer: BC Managed Care – PPO | Admitting: Internal Medicine

## 2012-12-28 ENCOUNTER — Encounter (INDEPENDENT_AMBULATORY_CARE_PROVIDER_SITE_OTHER): Payer: Self-pay | Admitting: Internal Medicine

## 2012-12-28 VITALS — BP 140/84 | HR 80 | Temp 98.1°F | Resp 16 | Ht 64.0 in | Wt 196.6 lb

## 2012-12-28 DIAGNOSIS — B1920 Unspecified viral hepatitis C without hepatic coma: Secondary | ICD-10-CM

## 2012-12-28 DIAGNOSIS — R7401 Elevation of levels of liver transaminase levels: Secondary | ICD-10-CM

## 2012-12-28 DIAGNOSIS — R945 Abnormal results of liver function studies: Secondary | ICD-10-CM | POA: Insufficient documentation

## 2012-12-28 DIAGNOSIS — Z8619 Personal history of other infectious and parasitic diseases: Secondary | ICD-10-CM

## 2012-12-28 NOTE — Patient Instructions (Signed)
Physician will contact you with results of blood work and ultrasound when completed. 

## 2012-12-28 NOTE — Progress Notes (Signed)
Presenting complaint;  Elevated transaminases. History of hepatitis C(successfully treated in October 2012.  Subjective:  Patient is 62 year old Caucasian female with history of chronic hepatitis C genotype 1 who was treated with Pegasys, ribavirin and telaprevir. She received 3 drugs  for first 12 weeks and Pegasys and ribavirin for total of 36 weeks. She finished therapy in October 2012 and HCVRNA by PCR was undetectable in April 2013. She has thalassemia minor and treatment was complicated by anemia despite use of Procrit right from the start. She required 7 units of PRBCs. She saw Dr. Selinda Flavin and had routine blood work done. She was noted to have elevated transaminases which last year were normal. He also did hepatitis C virus antibody which was positive. Patient has no symptoms other than she stays tired all the time and is not able to sleep well. Her husband has MS which is one reason for her not getting rest at night. Her husband is now wheelchair-bound because of back problems. In spite of her tightness she is able to do a usual household work without any difficulty. She has good appetite and her weight has remained stable since her last visit of March 2013. She denies abdominal pain nausea vomiting melena or rectal bleeding. Her heartburn is well controlled with therapy. She walks about 2 miles a day in spite of being tired.  Current Medications: Current Outpatient Prescriptions  Medication Sig Dispense Refill  . calcium citrate-vitamin D (CALCIUM + D) 315-200 MG-UNIT per tablet Take by mouth 2 (two) times daily.        . metoprolol (TOPROL-XL) 50 MG 24 hr tablet Take 100 mg by mouth daily.       Marland Kitchen omeprazole (PRILOSEC) 20 MG capsule Take 1 capsule (20 mg total) by mouth daily.  20 capsule  4   No current facility-administered medications for this visit.   Facility-Administered Medications Ordered in Other Visits  Medication Dose Route Frequency Provider Last Rate Last Dose    . 0.9 %  sodium chloride infusion   Intravenous Continuous Malissa Hippo, MD         Objective: Blood pressure 140/84, pulse 80, temperature 98.1 F (36.7 C), temperature source Oral, resp. rate 16, height 5\' 4"  (1.626 m), weight 196 lb 9.6 oz (89.177 kg). Patient is alert and in no acute distress. Conjunctiva is pink. Sclera is nonicteric Oropharyngeal mucosa is normal. No neck masses or thyromegaly noted. Cardiac exam with regular rhythm normal S1 and S2. No murmur or gallop noted. Lungs are clear to auscultation. Abdomen is full but soft and nontender without hepatosplenomegaly. No LE edema or clubbing noted.  Labs/studies Results: Lab data from Dr. Jeannette How office; tests done on 12/17/2022. HCV antibody is positive. WBC 11.9, H&H 11.9 and 37.1 platelet count 106K Bilirubin  0.5, AP 58, AST 41,, ALT 38, total protein 6.8, albumin 4.0, glucose 98, serum calcium  8.7 Electrolytes are within normal limits. And BUN 13 and creatinine 0.74. TSH 5.03   Assessment:  #1. Mildly elevated transaminases. Differential diagnosis includes fatty liver vs relapse of hep C. She finished antiviral therapy in October 2012 and had SVR(HCVRNA was undetectable in April 2013). She does not have any risk factors for reinfection. Positive HCV antibody is not indicative above recurrent infection. She will need HCVRNA by PCR to determine if she has relapsed. #2. Mild thrombocytopenia. She's had low platelet count for few years and may be a clue to underlying cirrhosis. Liver biopsy in April 2008 revealed stage III disease.  It is possible that she could have progressed to stage IV disease by that time she was treated.    Plan:  Will check alpha-fetoprotein and HCVRNA by PCR qualitative. Hepatobiliary [place]. Further recommendations to follow.

## 2012-12-28 NOTE — Telephone Encounter (Signed)
Per [facility]

## 2012-12-30 ENCOUNTER — Ambulatory Visit (HOSPITAL_COMMUNITY)
Admission: RE | Admit: 2012-12-30 | Discharge: 2012-12-30 | Disposition: A | Discharge status: Home / Self Care | Payer: BC Managed Care – PPO | Source: Ambulatory Visit | Attending: Internal Medicine | Admitting: Internal Medicine

## 2012-12-30 DIAGNOSIS — R7402 Elevation of levels of lactic acid dehydrogenase (LDH): Secondary | ICD-10-CM | POA: Insufficient documentation

## 2012-12-30 DIAGNOSIS — Z8619 Personal history of other infectious and parasitic diseases: Secondary | ICD-10-CM

## 2012-12-30 DIAGNOSIS — K7460 Unspecified cirrhosis of liver: Secondary | ICD-10-CM | POA: Insufficient documentation

## 2012-12-30 DIAGNOSIS — R7401 Elevation of levels of liver transaminase levels: Secondary | ICD-10-CM

## 2012-12-30 DIAGNOSIS — R932 Abnormal findings on diagnostic imaging of liver and biliary tract: Secondary | ICD-10-CM | POA: Insufficient documentation

## 2013-01-05 NOTE — Progress Notes (Signed)
Apt has been scheduled for 02/08/13 with Dr. Karilyn Cota.

## 2013-01-05 NOTE — Progress Notes (Signed)
LM for patient to return the call.  

## 2013-01-07 ENCOUNTER — Encounter (INDEPENDENT_AMBULATORY_CARE_PROVIDER_SITE_OTHER): Payer: Self-pay

## 2013-02-08 ENCOUNTER — Ambulatory Visit (INDEPENDENT_AMBULATORY_CARE_PROVIDER_SITE_OTHER): Payer: BC Managed Care – PPO | Admitting: Internal Medicine

## 2013-02-08 ENCOUNTER — Encounter (INDEPENDENT_AMBULATORY_CARE_PROVIDER_SITE_OTHER): Payer: Self-pay | Admitting: Internal Medicine

## 2013-02-08 VITALS — BP 120/78 | HR 80 | Temp 97.5°F | Resp 18 | Ht 64.0 in | Wt 199.9 lb

## 2013-02-08 DIAGNOSIS — B182 Chronic viral hepatitis C: Secondary | ICD-10-CM

## 2013-02-08 NOTE — Progress Notes (Signed)
Presenting complaint;  Followup for hepatitis C.  Subjective:  Patient is 62 year old Caucasian female patient of Dr. Selinda Flavin who is here for scheduled visit accompanied by her husband. She has history of chronic hepatitis C. genotype 1 grade 2 and stage III disease on liver biopsy of April 2008. She was initially treated by liver clinic in [facility] and therapy was discontinued after 17 weeks because of profound anemia and she was found to have thalassemia minor. Patient was retreated last year with 3 drugs for 36 weeks and had SVR. However her disease has relapsed. She is definitely interested in retreatment. She has no complaints. She has very good appetite. She denies abdominal pain weakness or fatigue. She is also not having any problem with fluid retention.  Current Medications: Current Outpatient Prescriptions  Medication Sig Dispense Refill  . calcium citrate-vitamin D (CALCIUM + D) 315-200 MG-UNIT per tablet Take by mouth 2 (two) times daily.        . metoprolol (TOPROL-XL) 50 MG 24 hr tablet Take 100 mg by mouth daily.       Marland Kitchen omeprazole (PRILOSEC) 20 MG capsule Take 1 capsule (20 mg total) by mouth daily.  20 capsule  4   No current facility-administered medications for this visit.   Facility-Administered Medications Ordered in Other Visits  Medication Dose Route Frequency Provider Last Rate Last Dose  . 0.9 %  sodium chloride infusion   Intravenous Continuous Malissa Hippo, MD         Objective: Blood pressure 120/78, pulse 80, temperature 97.5 F (36.4 C), temperature source Oral, resp. rate 18, height 5\' 4"  (1.626 m), weight 199 lb 14.4 oz (90.674 kg). Patient is alert and in no acute distress. Conjunctiva is pink. Sclera is nonicteric Oropharyngeal mucosa is normal. No neck masses or thyromegaly noted. Abdomen is full but soft and nontender without hepatosplenomegaly or masses.  No LE edema or clubbing noted.  Labs/studies Results: HCVRNA  by PCR qualitative positive on 12/28/2012. AFP was 1.4. Ultrasound on 01/03/2013 revealed 11 mm echogenic focus in the dependent part of the gallbladder felt to be a polyp. Bile duct measured 6 mm. Liver heterogeneous with mild nodularity consistent with cirrhosis. No ascites noted.     Assessment:  #1. Hepatitis C has relapsed. Liver biopsy in 2008 revealed stage III fibrosis. She does not have any stigmata of chronic liver disease. Ultrasound also does not show findings suggest portal hypertension and her AFP is normal. With the availability of new drugs her chances of cure are successful therapy are better than ever. Given she has history of thalassemia minor next treatment should not include ribavirin and she may also not even need interferon. #2.? Gallbladder polyp.   Plan:  Treatment options reviewed with the patient and we decided to delay therapy until next generation of medications available which would be later this year or early part of next year. I will review her ultrasound with radiologist and determine whether she needs a followup study or surgical consultation. Next ultrasound is planned for July 2014 along with AFP. Office visit in 8 months.

## 2013-02-08 NOTE — Patient Instructions (Addendum)
Physician will contact you with final result of ultrasound regarding gallbladder finding.

## 2013-02-09 ENCOUNTER — Telehealth (INDEPENDENT_AMBULATORY_CARE_PROVIDER_SITE_OTHER): Payer: Self-pay | Admitting: *Deleted

## 2013-02-09 DIAGNOSIS — B1920 Unspecified viral hepatitis C without hepatic coma: Secondary | ICD-10-CM

## 2013-02-09 NOTE — Telephone Encounter (Signed)
This lab is to be drawn in July 2014 per Dr.Rehman

## 2013-02-15 ENCOUNTER — Telehealth (INDEPENDENT_AMBULATORY_CARE_PROVIDER_SITE_OTHER): Payer: Self-pay | Admitting: *Deleted

## 2013-02-15 NOTE — Telephone Encounter (Signed)
LM asking for Tammy or Dr. Karilyn Cota to please return her call to [phone]. Roxine would like to get her CT results.

## 2013-02-15 NOTE — Telephone Encounter (Signed)
Dr. Karilyn Cota is going to call patient with results

## 2013-02-16 ENCOUNTER — Other Ambulatory Visit (INDEPENDENT_AMBULATORY_CARE_PROVIDER_SITE_OTHER): Payer: Self-pay | Admitting: Internal Medicine

## 2013-02-16 DIAGNOSIS — B1920 Unspecified viral hepatitis C without hepatic coma: Secondary | ICD-10-CM

## 2013-02-16 DIAGNOSIS — R935 Abnormal findings on diagnostic imaging of other abdominal regions, including retroperitoneum: Secondary | ICD-10-CM

## 2013-03-28 ENCOUNTER — Ambulatory Visit (HOSPITAL_COMMUNITY)
Admission: RE | Admit: 2013-03-28 | Discharge: 2013-03-28 | Disposition: A | Discharge status: Home / Self Care | Payer: 59 | Source: Ambulatory Visit | Attending: Internal Medicine | Admitting: Internal Medicine

## 2013-03-28 DIAGNOSIS — R935 Abnormal findings on diagnostic imaging of other abdominal regions, including retroperitoneum: Secondary | ICD-10-CM

## 2013-03-28 DIAGNOSIS — B182 Chronic viral hepatitis C: Secondary | ICD-10-CM | POA: Insufficient documentation

## 2013-03-28 DIAGNOSIS — K7460 Unspecified cirrhosis of liver: Secondary | ICD-10-CM | POA: Insufficient documentation

## 2013-03-28 DIAGNOSIS — B1920 Unspecified viral hepatitis C without hepatic coma: Secondary | ICD-10-CM

## 2013-04-06 ENCOUNTER — Telehealth (INDEPENDENT_AMBULATORY_CARE_PROVIDER_SITE_OTHER): Payer: Self-pay | Admitting: *Deleted

## 2013-04-06 NOTE — Telephone Encounter (Signed)
Patient called and voice message left for her to call .

## 2013-04-06 NOTE — Telephone Encounter (Signed)
Patient returned Tammy's call and asked if she would please call her back.

## 2013-04-06 NOTE — Telephone Encounter (Signed)
LM asking for [name] to please return her call at [phone]. She is needing to clarify the information Dr. Karilyn Cota left her about a liver biopsy and her gallbladder.

## 2013-04-11 NOTE — Telephone Encounter (Signed)
Luster Landsberg handled this on Friday with Dr.Rehman

## 2013-04-19 ENCOUNTER — Other Ambulatory Visit (INDEPENDENT_AMBULATORY_CARE_PROVIDER_SITE_OTHER): Payer: Self-pay | Admitting: *Deleted

## 2013-04-19 ENCOUNTER — Ambulatory Visit (INDEPENDENT_AMBULATORY_CARE_PROVIDER_SITE_OTHER): Payer: Self-pay | Admitting: General Surgery

## 2013-04-19 DIAGNOSIS — R109 Unspecified abdominal pain: Secondary | ICD-10-CM

## 2013-04-20 LAB — COMPREHENSIVE METABOLIC PANEL
Alkaline Phosphatase: 72 U/L (ref 39–117)
BUN: 13 mg/dL (ref 6–23)
Creat: 0.68 mg/dL (ref 0.50–1.10)
Glucose, Bld: 118 mg/dL — ABNORMAL HIGH (ref 70–99)
Sodium: 139 mEq/L (ref 135–145)
Total Bilirubin: 1 mg/dL (ref 0.3–1.2)

## 2013-04-20 LAB — CBC WITH DIFFERENTIAL/PLATELET
Basophils Relative: 0 % (ref 0–1)
Eosinophils Absolute: 0.1 10*3/uL (ref 0.0–0.7)
Eosinophils Relative: 1 % (ref 0–5)
Hemoglobin: 11.9 g/dL — ABNORMAL LOW (ref 12.0–15.0)
MCH: 21 pg — ABNORMAL LOW (ref 26.0–34.0)
MCHC: 33 g/dL (ref 30.0–36.0)
MCV: 63.7 fL — ABNORMAL LOW (ref 78.0–100.0)
Monocytes Absolute: 0.5 10*3/uL (ref 0.1–1.0)
Monocytes Relative: 6 % (ref 3–12)
Neutrophils Relative %: 62 % (ref 43–77)

## 2013-04-20 LAB — PROTIME-INR
INR: 1.04 (ref <1.50)
Prothrombin Time: 13.6 seconds (ref 11.6–15.2)

## 2013-04-21 LAB — APTT: aPTT: 35 seconds (ref 24–37)

## 2013-04-27 ENCOUNTER — Encounter (INDEPENDENT_AMBULATORY_CARE_PROVIDER_SITE_OTHER): Payer: Self-pay | Admitting: General Surgery

## 2013-04-27 ENCOUNTER — Ambulatory Visit (INDEPENDENT_AMBULATORY_CARE_PROVIDER_SITE_OTHER): Payer: 59 | Admitting: General Surgery

## 2013-04-27 VITALS — BP 154/74 | HR 71 | Temp 96.8°F | Ht 64.5 in | Wt 201.2 lb

## 2013-04-27 DIAGNOSIS — Z8619 Personal history of other infectious and parasitic diseases: Secondary | ICD-10-CM

## 2013-04-27 DIAGNOSIS — K824 Cholesterolosis of gallbladder: Secondary | ICD-10-CM

## 2013-04-27 NOTE — Progress Notes (Signed)
Patient ID: [name], female   DOB: 1951/08/30, 62 y.o.   MRN: [id number]  Chief Complaint  Patient presents with  . New Evaluation    eval gallbladder    HPI [name] is a 62 y.o. female.  She is referred by Dr. Karilyn Cota for a surgical opinion for gallbladder polyps, 1 cm diameter. Dr. Selinda Flavin is her primary care physician.  The patient has cirrhosis and hepatitis C. Stage III by liver biopsy 2008. She also has thalassemia minor and had to have blood transfusions during her pregnancy, which is thought to be the way she got hepatitis C. She has had antiviral therapy in the past. She's had a C-section in the past. She has minimal symptoms other than fatigue. She has no abdominal pain. She has actually gained about 30 pounds in the last year or 2. Denies nausea or swallowing problems. Nothing to suggest biliary colic.  Gallbladder ultrasound 01/03/2013 shows an 11 mm polyp, CBD 6 mm, no ascites. Ultrasound 04/05/2013 shows 2 non-mobile non-echogenic foci. The largest measures 10 mm. No sign of gallbladder wall thickening. No inflammation. No liver mass. Portal vein is open. No ductal dilatation.  Lab work was done recently which looks good. Liver function test normal except for ALT 39. Albumin 4.1. PT and PTT normal. Hemoglobin 11.9. Platelet count 144,000.  HPI  Past Medical History  Diagnosis Date  . Anemia   . Hepatitis C  . Blood transfusion without reported diagnosis   . GERD (gastroesophageal reflux disease)   . Hypertension     Past Surgical History  Procedure Laterality Date  . Colonoscopy  09/25/06    ROURK  . Upper gastrointestinal endoscopy  07/20/2009  . Upper gastrointestinal endoscopy  09/25/06    ROURK  . Liver biopsy  03/19/07  . Cesarean section  06/30/86  . Tonsillectomy  1960    Family History  Problem Relation Age of Onset  . Heart disease Mother   . Heart disease Father   . Cancer Father     lung  . Healthy Sister   . Healthy Daughter    . Healthy Daughter   . Healthy Son     Social History History  Substance Use Topics  . Smoking status: Former Smoker    Types: Cigarettes    Quit date: 10/08/2012  . Smokeless tobacco: Never Used  . Alcohol Use: No    No Known Allergies  Current Outpatient Prescriptions  Medication Sig Dispense Refill  . calcium citrate-vitamin D (CALCIUM + D) 315-200 MG-UNIT per tablet Take by mouth 2 (two) times daily.        . metoprolol (TOPROL-XL) 50 MG 24 hr tablet Take 100 mg by mouth daily.       Marland Kitchen omeprazole (PRILOSEC) 20 MG capsule Take 1 capsule (20 mg total) by mouth daily.  20 capsule  4   No current facility-administered medications for this visit.   Facility-Administered Medications Ordered in Other Visits  Medication Dose Route Frequency Provider Last Rate Last Dose  . 0.9 %  sodium chloride infusion   Intravenous Continuous Malissa Hippo, MD        Review of Systems Review of Systems  Constitutional: Positive for fatigue and unexpected weight change. Negative for fever and chills.  HENT: Negative for hearing loss, congestion, sore throat, trouble swallowing and voice change.   Eyes: Negative for visual disturbance.  Respiratory: Negative for cough and wheezing.   Cardiovascular: Negative for chest pain, palpitations and leg  swelling.  Gastrointestinal: Negative for nausea, vomiting, abdominal pain, diarrhea, constipation, blood in stool, abdominal distention and anal bleeding.  Genitourinary: Negative for hematuria, vaginal bleeding and difficulty urinating.  Musculoskeletal: Negative for arthralgias.  Skin: Negative for rash and wound.  Neurological: Negative for seizures, syncope and headaches.  Hematological: Negative for adenopathy. Does not bruise/bleed easily.  Psychiatric/Behavioral: Negative for confusion.    Blood pressure 154/74, pulse 71, temperature 96.8 F (36 C), temperature source Temporal, height 5' 4.5" (1.638 m), weight 201 lb 3.2 oz (91.264 kg),  SpO2 97.00%.  Physical Exam Physical Exam  Constitutional: She is oriented to person, place, and time. She appears well-developed and well-nourished. No distress.  HENT:  Head: Normocephalic and atraumatic.  Nose: Nose normal.  Mouth/Throat: No oropharyngeal exudate.  Eyes: Conjunctivae and EOM are normal. Pupils are equal, round, and reactive to light. Left eye exhibits no discharge. No scleral icterus.  Neck: Neck supple. No JVD present. No tracheal deviation present. No thyromegaly present.  Cardiovascular: Normal rate, regular rhythm, normal heart sounds and intact distal pulses.   No murmur heard. Pulmonary/Chest: Effort normal and breath sounds normal. No respiratory distress. She has no wheezes. She has no rales. She exhibits no tenderness.  Abdominal: Soft. Bowel sounds are normal. She exhibits no distension and no mass. There is no tenderness. There is no rebound and no guarding.  Liver not palpable. No tenderness. Well-healed Pfannenstiel incision. Overweight.  Musculoskeletal: She exhibits no edema and no tenderness.  Lymphadenopathy:    She has no cervical adenopathy.  Neurological: She is alert and oriented to person, place, and time. She exhibits normal muscle tone. Coordination normal.  Skin: Skin is warm. No rash noted. She is not diaphoretic. No erythema. No pallor.  Psychiatric: She has a normal mood and affect. Her behavior is normal. Judgment and thought content normal.    Data Reviewed Lab work. Ultrasound. Dr. Patty Sermons. Office notes.  Assessment    Gallbladder polyps versus non-mobile gallstones. Asymptomatic. If these are truly polyps, and then the size does warrant a surgical decision because of the risk of malignant transformation.  Cirrhosis and hepatitis C. This would significantly increase her risk for laparoscopic cholecystectomy, and will need to be factored in to a risk versus benefits decision.   Obesity, mild.    Plan    I had a long talk with  the patient about the differential diagnosis, algorithms for care this setting, benefits of surgery, risks of surgery. She is well aware of this.  We will schedule her for an MRI and MRCP to see if there is any evidence of neoplasia.  She will return to see me after this is done and we will discuss pros and cons of elective cholecystectomy.        Angelia Mould. Derrell Lolling, M.D., [facility] General and Minimally invasive Surgery Breast and Colorectal Surgery Office:   [phone] Pager:   [phone]  04/27/2013, 4:45 PM

## 2013-04-27 NOTE — Patient Instructions (Signed)
You  have 2 filling defects in your gallbladder on the ultrasound. It is possible that these are gallbladder polyps. They are 10 mm in diameter which is the size iathich we begin to consider elective gallbladder surgery. There is a small but definite risk that these could turn into gallbladder cancer.  Your cirrhosis and hepatitis C significantly increased risk of gallbladder surgery, and so I do not want to immediately rush into surgery.  You will be scheduled for MRI of the liver and MRCP to better assess the gallbladder and biliary tree.  Return to see Dr. Derrell Lolling after the MRCP is done.

## 2013-04-28 ENCOUNTER — Telehealth (INDEPENDENT_AMBULATORY_CARE_PROVIDER_SITE_OTHER): Payer: Self-pay | Admitting: General Surgery

## 2013-04-28 ENCOUNTER — Other Ambulatory Visit (INDEPENDENT_AMBULATORY_CARE_PROVIDER_SITE_OTHER): Payer: Self-pay

## 2013-04-28 DIAGNOSIS — K824 Cholesterolosis of gallbladder: Secondary | ICD-10-CM

## 2013-04-28 NOTE — Telephone Encounter (Signed)
Patient is aware of scan [address] at 245 NPO 4 hr prior

## 2013-04-29 ENCOUNTER — Other Ambulatory Visit: Payer: Self-pay | Admitting: Obstetrics and Gynecology

## 2013-05-01 ENCOUNTER — Ambulatory Visit
Admission: RE | Admit: 2013-05-01 | Discharge: 2013-05-01 | Disposition: A | Discharge status: Home / Self Care | Payer: Self-pay | Source: Ambulatory Visit | Attending: General Surgery | Admitting: General Surgery

## 2013-05-01 DIAGNOSIS — K824 Cholesterolosis of gallbladder: Secondary | ICD-10-CM

## 2013-05-01 MED ORDER — GADOBENATE DIMEGLUMINE 529 MG/ML IV SOLN
19.0000 mL | Freq: Once | INTRAVENOUS | Status: AC | PRN
  Administered 2013-05-01: 19 mL via INTRAVENOUS

## 2013-05-03 ENCOUNTER — Telehealth (INDEPENDENT_AMBULATORY_CARE_PROVIDER_SITE_OTHER): Payer: Self-pay

## 2013-05-03 NOTE — Telephone Encounter (Signed)
I called and notified the pt of the mri results and scheduled her to come back in and follow up

## 2013-05-31 ENCOUNTER — Encounter (INDEPENDENT_AMBULATORY_CARE_PROVIDER_SITE_OTHER): Payer: Self-pay | Admitting: General Surgery

## 2013-05-31 ENCOUNTER — Ambulatory Visit (INDEPENDENT_AMBULATORY_CARE_PROVIDER_SITE_OTHER): Payer: 59 | Admitting: General Surgery

## 2013-05-31 VITALS — BP 130/78 | HR 76 | Temp 98.3°F | Resp 18 | Ht 65.0 in | Wt 204.6 lb

## 2013-05-31 DIAGNOSIS — K8020 Calculus of gallbladder without cholecystitis without obstruction: Secondary | ICD-10-CM

## 2013-05-31 NOTE — Progress Notes (Signed)
Patient ID: [name], female   DOB: [birth year], 62 y.o.   MRN: [id number] History: This patient returns for further evaluation of her gallbladder.  she has cirrhosis and hepatitis C, stage III by liver biopsy. She also has thalassemia minor and has required transfusions in the past. She has minimal abdominal symptoms. No abdominal pain. Rare episodes of nausea. No swallowing problems. Nothing consistent with biliary colic. Gallbladder ultrasound on January 27 and again on 04/05/2013 showed non-mobile echogenic foci. Because one of these was 11 mm there is concern this was a polyp and might be a premalignant change. MRI was performed and this confirms gallstones. There is no mass or enhancing mass or malignancy seen. No CBD filling defect or dilatation.  Exam: Patient looks well. Pale skin color. Alert and pleasant. Neck no adenopathy or mass. Abdomen soft. Nontender. Liver not palpable. Well-healed Pfannenstiel incision. Overweight  Assessment asymptomatic gallstones Cirrhosis and hepatitis C. This significantly increases her risk for any surgical intervention including laparoscopic cholecystectomy Mild obesity  Plan: We had a long talk with the patient.  Both she and I are relieved somewhat that there is no evidence of cancer and that her diagnosis appears to be asymptomatic gallstones. I advised against elective cholecystectomy unless she becomes more symptomatic. Return to see me if further surgical problems arise.   Angelia Mould. Derrell Lolling, M.D., [facility] General and Minimally invasive Surgery Breast and Colorectal Surgery Office:   [phone] Pager:   [phone]

## 2013-05-31 NOTE — Patient Instructions (Signed)
Your MRI confirms that you have gallstones. There is no evidence of gallbladder polyp. There is no evidence of mass or malignancy. There is no evidence of stones in the main bile duct. There is no evidence of obstruction.  Since you do not have any significant symptoms of gallbladder disease, and because of your cirrhosis and hepatitis, I advised against a cholecystectomy at this point in time.  It is possible you may develop gallbladder attacks in the future, and it is possible that you may require cholecystectomy in the future. It is also possible that you may never require cholecystectomy.  Return to see Dr. Derrell Lolling if further surgical problems arise.

## 2013-06-01 ENCOUNTER — Other Ambulatory Visit (INDEPENDENT_AMBULATORY_CARE_PROVIDER_SITE_OTHER): Payer: Self-pay | Admitting: *Deleted

## 2013-06-01 ENCOUNTER — Encounter (INDEPENDENT_AMBULATORY_CARE_PROVIDER_SITE_OTHER): Payer: Self-pay | Admitting: *Deleted

## 2013-06-01 DIAGNOSIS — B182 Chronic viral hepatitis C: Secondary | ICD-10-CM

## 2013-06-01 DIAGNOSIS — B1920 Unspecified viral hepatitis C without hepatic coma: Secondary | ICD-10-CM

## 2013-06-08 ENCOUNTER — Telehealth (INDEPENDENT_AMBULATORY_CARE_PROVIDER_SITE_OTHER): Payer: Self-pay | Admitting: *Deleted

## 2013-06-08 NOTE — Telephone Encounter (Signed)
LM for [name] to let her know she is now working in [place] and her labs will need to go up stairs to [facility] here in [place]. Please do not send them to [place]. [name] will have the blood work done on 06/13/13. If [name] has any questions to please give her a return call at [phone]. Labs have been faxed again up stairs with confirmation that it has gone thru.

## 2013-06-08 NOTE — Telephone Encounter (Signed)
Message Noted , and thank you Lupita Leash. Patient will be contacted by Dr.Rehman or myself after her labs have been completed and results ready.

## 2013-06-16 LAB — AFP TUMOR MARKER: AFP-Tumor Marker: 4.1 ng/mL (ref 0.0–8.0)

## 2013-06-27 ENCOUNTER — Telehealth (INDEPENDENT_AMBULATORY_CARE_PROVIDER_SITE_OTHER): Payer: Self-pay | Admitting: *Deleted

## 2013-06-27 DIAGNOSIS — R7401 Elevation of levels of liver transaminase levels: Secondary | ICD-10-CM

## 2013-06-27 DIAGNOSIS — Z8719 Personal history of other diseases of the digestive system: Secondary | ICD-10-CM

## 2013-06-27 NOTE — Telephone Encounter (Signed)
Per Dr.Rehman the patient will need to have labs drawn in 6 months. 

## 2013-10-11 ENCOUNTER — Ambulatory Visit (INDEPENDENT_AMBULATORY_CARE_PROVIDER_SITE_OTHER): Payer: 59 | Admitting: Internal Medicine

## 2013-10-11 ENCOUNTER — Encounter (INDEPENDENT_AMBULATORY_CARE_PROVIDER_SITE_OTHER): Payer: Self-pay | Admitting: Internal Medicine

## 2013-10-11 ENCOUNTER — Telehealth (INDEPENDENT_AMBULATORY_CARE_PROVIDER_SITE_OTHER): Payer: Self-pay | Admitting: *Deleted

## 2013-10-11 ENCOUNTER — Other Ambulatory Visit (INDEPENDENT_AMBULATORY_CARE_PROVIDER_SITE_OTHER): Payer: Self-pay | Admitting: Internal Medicine

## 2013-10-11 VITALS — BP 138/90 | HR 82 | Temp 97.5°F | Resp 18 | Ht 64.0 in | Wt 205.3 lb

## 2013-10-11 DIAGNOSIS — R110 Nausea: Secondary | ICD-10-CM

## 2013-10-11 DIAGNOSIS — K7400 Hepatic fibrosis, unspecified: Secondary | ICD-10-CM

## 2013-10-11 DIAGNOSIS — B1920 Unspecified viral hepatitis C without hepatic coma: Secondary | ICD-10-CM

## 2013-10-11 DIAGNOSIS — B182 Chronic viral hepatitis C: Secondary | ICD-10-CM

## 2013-10-11 DIAGNOSIS — K7460 Unspecified cirrhosis of liver: Secondary | ICD-10-CM

## 2013-10-11 MED ORDER — SUCRALFATE 1 G PO TABS: 2.0000 g | ORAL_TABLET | Freq: Every day | ORAL | Status: DC

## 2013-10-11 NOTE — Patient Instructions (Signed)
Physician will contact you with results of blood work when completed. 

## 2013-10-11 NOTE — Progress Notes (Signed)
Presenting complaint;  Follow for chronic hepatitis C. Patient interested in retreatment.  Subjective:  Patient is 62 year old Caucasian female who has chronic hepatitis C genotype 1 stage III disease who was last treated with 3 drugs for 36 weeks but did not achieve SVR. She is not interested in retreatment with newer agents. Earlier this year she was evaluated and found to have gallstones. She was seen by Dr. Claud Kelp who did MRI and recommended observation. She continues to complain of intermittent nausea which is experienced when she wakes up in the morning and usually goes away by mid day. Nausea does not seem to occur after meals. She denies vomiting abdominal pain melena or rectal bleeding. She feels she is more fatigued now than she was last year. She blames it on her age.   Current Medications: Current Outpatient Prescriptions  Medication Sig Dispense Refill  . calcium citrate-vitamin D (CALCIUM + D) 315-200 MG-UNIT per tablet Take by mouth 2 (two) times daily.        . metoprolol (TOPROL-XL) 50 MG 24 hr tablet Take 100 mg by mouth daily.       Marland Kitchen omeprazole (PRILOSEC OTC) 20 MG tablet Take 20 mg by mouth as needed.      Marland Kitchen omeprazole (PRILOSEC) 20 MG capsule Take 1 capsule (20 mg total) by mouth daily.  20 capsule  4   No current facility-administered medications for this visit.   Facility-Administered Medications Ordered in Other Visits  Medication Dose Route Frequency Provider Last Rate Last Dose  . 0.9 %  sodium chloride infusion   Intravenous Continuous Malissa Hippo, MD         Objective: Blood pressure 138/90, pulse 82, temperature 97.5 F (36.4 C), temperature source Oral, resp. rate 18, height 5\' 4"  (1.626 m), weight 205 lb 4.8 oz (93.123 kg). Patient is alert and in no acute distress. Conjunctiva is pink. Sclera is nonicteric Oropharyngeal mucosa is normal. No neck masses or thyromegaly noted. Cardiac exam with regular rhythm normal S1 and S2. No murmur or  gallop noted. Lungs are clear to auscultation. Abdomen is full but soft and nontender. Liver edge is palpable below RCM. Spleen is not palpable.  No LE edema or clubbing noted.   Assessment:  #1. Chronic hepatitis C genotype 1 stage III fibrosis; subtype unknown. 36 weeks of therapy with Pegasys, ribavirin and telaprevir failed to achieve SVR. HCV and it was undetectable at EOT. She has thalassemia minor and had to be supported with 8 units of PRBCs while on therapy. Chance of success with current therapy with horvoni is in excess of 95%. #2. Nausea possibly due to duodenogastric bile reflux. #3. Cholelithiasis felt to be asymptomatic.     Plan:  Will obtain CBC, comprehensive chemistry panel, AFP an HCV RNA by PCR quantitative and genotype prior to initiating therapy with Horvoni 1 pill daily for 12 weeks(combination of Sofosbuvir and Ledipasvir). Sucralfate 2 g by mouth each bedtime. Office visit in 8 weeks.

## 2013-10-11 NOTE — Telephone Encounter (Signed)
Per Dr.Rehman the patient will need to have labs drawn. 

## 2013-10-12 LAB — CBC
Platelets: 153 10*3/uL (ref 150–400)
RBC: 5.8 MIL/uL — ABNORMAL HIGH (ref 3.87–5.11)
RDW: 16.1 % — ABNORMAL HIGH (ref 11.5–15.5)
WBC: 8.4 10*3/uL (ref 4.0–10.5)

## 2013-10-12 LAB — COMPREHENSIVE METABOLIC PANEL
ALT: 42 U/L — ABNORMAL HIGH (ref 0–35)
AST: 42 U/L — ABNORMAL HIGH (ref 0–37)
CO2: 25 mEq/L (ref 19–32)
Calcium: 9 mg/dL (ref 8.4–10.5)
Chloride: 105 mEq/L (ref 96–112)
Sodium: 138 mEq/L (ref 135–145)
Total Protein: 6.8 g/dL (ref 6.0–8.3)

## 2013-10-12 LAB — AFP TUMOR MARKER: AFP-Tumor Marker: 3.8 ng/mL (ref 0.0–8.0)

## 2013-10-13 ENCOUNTER — Other Ambulatory Visit: Payer: Self-pay

## 2013-10-18 LAB — HEPATITIS C GENOTYPE

## 2013-10-19 ENCOUNTER — Ambulatory Visit (HOSPITAL_COMMUNITY): Payer: 59 | Admitting: Psychiatry

## 2013-10-31 ENCOUNTER — Telehealth (INDEPENDENT_AMBULATORY_CARE_PROVIDER_SITE_OTHER): Payer: Self-pay | Admitting: *Deleted

## 2013-10-31 NOTE — Telephone Encounter (Signed)
The Harvoni was sent to [employer] [facility] and they can not get it processed. Would like for Tammy to send it to [facility] and the fax number is [phone]. This is what her insurance company is requiring. Nalani can be reached at [phone] if there are any questions.

## 2013-10-31 NOTE — Telephone Encounter (Signed)
I have rec'd information from [employer], [name] will be contacted after we share information with [facility].

## 2013-11-10 ENCOUNTER — Telehealth (INDEPENDENT_AMBULATORY_CARE_PROVIDER_SITE_OTHER): Payer: Self-pay | Admitting: *Deleted

## 2013-11-10 DIAGNOSIS — B1920 Unspecified viral hepatitis C without hepatic coma: Secondary | ICD-10-CM

## 2013-11-10 NOTE — Telephone Encounter (Signed)
Patient states that she has OV with Dr.Rehman already on 12/26/13. Patient was advised that we would share this with him and make sure that it is not to far out, as she is starting treatment today. She was told that Lupita Leash would let her know.

## 2013-11-10 NOTE — Telephone Encounter (Signed)
Per Dr.Rehman the patient may start medication today,11/10/13. Lab work -CBC in 2 weeks, 11/24/13. Lab work 4 weeks, CBC, LFT, HCV RNA QUANT,12/08/13- note this may have to be on 12/09/13. Patient will need to have a office visit in 5 weeks - 12/15/13 with Dr.Rehman. Patient was called and made aware. Forwarded to Lupita Leash to make the requested office visit per Dr.Rehman.

## 2013-11-10 NOTE — Telephone Encounter (Signed)
She has received the [facility] medicine. Would like to know when she should start it? The return phone number is [phone].

## 2013-11-17 ENCOUNTER — Other Ambulatory Visit (INDEPENDENT_AMBULATORY_CARE_PROVIDER_SITE_OTHER): Payer: Self-pay | Admitting: *Deleted

## 2013-11-17 ENCOUNTER — Telehealth (INDEPENDENT_AMBULATORY_CARE_PROVIDER_SITE_OTHER): Payer: Self-pay | Admitting: *Deleted

## 2013-11-17 ENCOUNTER — Encounter (INDEPENDENT_AMBULATORY_CARE_PROVIDER_SITE_OTHER): Payer: Self-pay | Admitting: *Deleted

## 2013-11-17 DIAGNOSIS — Z8719 Personal history of other diseases of the digestive system: Secondary | ICD-10-CM

## 2013-11-17 DIAGNOSIS — R7401 Elevation of levels of liver transaminase levels: Secondary | ICD-10-CM

## 2013-11-17 DIAGNOSIS — B1920 Unspecified viral hepatitis C without hepatic coma: Secondary | ICD-10-CM

## 2013-11-17 NOTE — Telephone Encounter (Signed)
This lab order was sent out.

## 2013-11-22 ENCOUNTER — Telehealth (INDEPENDENT_AMBULATORY_CARE_PROVIDER_SITE_OTHER): Payer: Self-pay | Admitting: *Deleted

## 2013-11-22 NOTE — Telephone Encounter (Signed)
[name] started the Harvoni two weeks ago and is now having trouble with her BP. Would like to know if Dr. Karilyn Cota thinks the [facility] would have anything to do with her BP which is running really high. The return phone number is [phone].

## 2013-11-22 NOTE — Telephone Encounter (Signed)
To be discussed with Dr.Rehman. 

## 2013-11-28 ENCOUNTER — Other Ambulatory Visit (INDEPENDENT_AMBULATORY_CARE_PROVIDER_SITE_OTHER): Payer: Self-pay | Admitting: Internal Medicine

## 2013-11-28 NOTE — Telephone Encounter (Signed)
[name] would like to know if Dr. Karilyn Cota would call her in something. She states the Harvoni has caused her to have blisters in her mouth, tongue white and very soar. Her return phone number is [phone].

## 2013-11-28 NOTE — Telephone Encounter (Signed)
Talked with  Patient. She is afebrile. Blood pressure is now normal. Prescription for [employer]ukes Magic mouthwash called to her pharmacy one teaspoonful swish and swallow 4 times a day for 10 days with one refill Patient advised to call office with progress report tomorrow

## 2013-11-28 NOTE — Telephone Encounter (Signed)
Forwarded to Dr.Rehman to prescribe and make decision about Harvoni.

## 2013-11-29 ENCOUNTER — Telehealth (INDEPENDENT_AMBULATORY_CARE_PROVIDER_SITE_OTHER): Payer: Self-pay | Admitting: *Deleted

## 2013-11-29 NOTE — Telephone Encounter (Signed)
FYI - The mouth rinse has really helped.

## 2013-11-29 NOTE — Telephone Encounter (Signed)
Noted, and Dr.Rehman was made aware.

## 2013-12-05 ENCOUNTER — Ambulatory Visit (HOSPITAL_COMMUNITY): Payer: 59 | Admitting: Psychiatry

## 2013-12-10 LAB — HEPATIC FUNCTION PANEL
ALT: 17 U/L (ref 0–35)
AST: 19 U/L (ref 0–37)
Albumin: 4 g/dL (ref 3.5–5.2)
Alkaline Phosphatase: 60 U/L (ref 39–117)
Bilirubin, Direct: 0.2 mg/dL (ref 0.0–0.3)
Indirect Bilirubin: 0.8 mg/dL (ref 0.0–0.9)
Total Bilirubin: 1 mg/dL (ref 0.3–1.2)
Total Protein: 6.9 g/dL (ref 6.0–8.3)

## 2013-12-10 LAB — AFP TUMOR MARKER: AFP-Tumor Marker: 2.9 ng/mL (ref 0.0–8.0)

## 2013-12-10 LAB — CBC
HCT: 36.9 % (ref 36.0–46.0)
Hemoglobin: 12.1 g/dL (ref 12.0–15.0)
MCH: 21 pg — ABNORMAL LOW (ref 26.0–34.0)
MCHC: 32.8 g/dL (ref 30.0–36.0)
MCV: 64 fL — ABNORMAL LOW (ref 78.0–100.0)
Platelets: 167 10*3/uL (ref 150–400)
RBC: 5.77 MIL/uL — ABNORMAL HIGH (ref 3.87–5.11)
RDW: 16 % — ABNORMAL HIGH (ref 11.5–15.5)
WBC: 8.9 10*3/uL (ref 4.0–10.5)

## 2013-12-12 LAB — HEPATITIS C RNA QUANTITATIVE: HCV Quantitative: NOT DETECTED IU/mL (ref <15)

## 2013-12-26 ENCOUNTER — Ambulatory Visit (INDEPENDENT_AMBULATORY_CARE_PROVIDER_SITE_OTHER): Payer: 59 | Admitting: Internal Medicine

## 2013-12-26 ENCOUNTER — Encounter (INDEPENDENT_AMBULATORY_CARE_PROVIDER_SITE_OTHER): Payer: Self-pay | Admitting: Internal Medicine

## 2013-12-26 VITALS — HR 64 | Temp 97.6°F | Resp 20 | Ht 64.0 in | Wt 202.0 lb

## 2013-12-26 DIAGNOSIS — B182 Chronic viral hepatitis C: Secondary | ICD-10-CM

## 2013-12-26 NOTE — Patient Instructions (Signed)
Next blood work in first week of March 2015.

## 2013-12-26 NOTE — Progress Notes (Signed)
Presenting complaint;  Follow for hepatitis C. Patient is undergoing therapy   Subjective:  Patient is 63-year-old Caucasian female who has CHC genotype 1B stage III disease who failed 36 weeks of Pegasys, ribavirin and Telepravir. She was begun on Harvoni on 11/09/2013. She has experienced no side effects whatsoever. She remains with good appetite. She has not gained or lost any weight since her last visit. She states she is trying to walk some and also rice on stationary bike. He does not take omeprazole daily.  Current Medications: Current Outpatient Prescriptions  Medication Sig Dispense Refill  . hydrochlorothiazide (HYDRODIURIL) 25 MG tablet Take 25 mg by mouth daily.      . Ledipasvir-Sofosbuvir (HARVONI) 90-400 MG TABS Take 1 tablet by mouth daily. Start date 11-09-2013.      . calcium citrate-vitamin D (CALCIUM + D) 315-200 MG-UNIT per tablet Take by mouth 2 (two) times daily.        . metoprolol (TOPROL-XL) 50 MG 24 hr tablet Take 100 mg by mouth daily.       Marland Kitchen. omeprazole (PRILOSEC OTC) 20 MG tablet Take 20 mg by mouth as needed.      Marland Kitchen. omeprazole (PRILOSEC) 20 MG capsule Take 1 capsule (20 mg total) by mouth daily.  20 capsule  4  . sucralfate (CARAFATE) 1 G tablet Take 2 tablets (2 g total) by mouth at bedtime.  60 tablet  2   No current facility-administered medications for this visit.   Facility-Administered Medications Ordered in Other Visits  Medication Dose Route Frequency Provider Last Rate Last Dose  . 0.9 %  sodium chloride infusion   Intravenous Continuous Malissa HippoNajeeb U Harald Quevedo, MD         Objective: Weight 202 lbs. she is 64 inches tall  Pulse 64 per minute. Temp 97.6 Conjunctiva is pink. Sclera is nonicteric Oropharyngeal mucosa is normal. No neck masses or thyromegaly noted. Cardiac exam with regular rhythm normal S1 and S2. No murmur or gallop noted. Lungs are clear to auscultation. Abdomen is full but soft and nontender without organomegaly or masses. No LE  edema or clubbing noted.  Labs/studies Results: Lab data from 12/09/2013. WBC 8.9, H&H 12.1 and 36.9, platelet count 167K. Bilirubin 1.0, AP 60, AST 19, ALT 17, total protein 6.9 albumin of 4.0. HCVRNA is undetectable  Assessment:  #1. Chronic hepatitis C genotype 1B stage III disease was failed therapy with Pegasys, ribavirin and Telepravir who is in 2 weeks 7 with Harvoni. Not unexpectedly HCV RNA is undetectable. Based on ION-2 study she should be treated for 24 weeks in order to improve SVR from 94-99%. #2. Patient has history of thalassemia minor and prior therapy resulted in anemia requiring transfusion. Her H&H is now normal.   Plan:  Would plan 24 weeks of therapy the hope it would be covered by her plan. She will have LFTs and HCV RNA at week 12 and then at EOT. Office visit when she has completed therapy.

## 2014-01-19 ENCOUNTER — Encounter (INDEPENDENT_AMBULATORY_CARE_PROVIDER_SITE_OTHER): Payer: Self-pay | Admitting: Internal Medicine

## 2014-02-16 ENCOUNTER — Encounter (INDEPENDENT_AMBULATORY_CARE_PROVIDER_SITE_OTHER): Payer: Self-pay | Admitting: Internal Medicine

## 2014-02-16 ENCOUNTER — Telehealth (INDEPENDENT_AMBULATORY_CARE_PROVIDER_SITE_OTHER): Payer: Self-pay | Admitting: *Deleted

## 2014-02-16 DIAGNOSIS — B1920 Unspecified viral hepatitis C without hepatic coma: Secondary | ICD-10-CM

## 2014-02-16 NOTE — Telephone Encounter (Signed)
Per Dr.Rehman the patient will need to have labs drawn 12 weeks of treatment

## 2014-02-18 ENCOUNTER — Other Ambulatory Visit (INDEPENDENT_AMBULATORY_CARE_PROVIDER_SITE_OTHER): Payer: Self-pay | Admitting: Internal Medicine

## 2014-02-21 ENCOUNTER — Encounter (INDEPENDENT_AMBULATORY_CARE_PROVIDER_SITE_OTHER): Payer: Self-pay | Admitting: Internal Medicine

## 2014-02-21 LAB — HEPATITIS C RNA QUANTITATIVE: HCV QUANT: NOT DETECTED [IU]/mL (ref <15)

## 2014-02-21 LAB — HEPATIC FUNCTION PANEL
ALBUMIN: 3.9 g/dL (ref 3.5–5.2)
ALK PHOS: 60 U/L (ref 39–117)
ALT: 14 U/L (ref 0–35)
AST: 18 U/L (ref 0–37)
BILIRUBIN TOTAL: 1 mg/dL (ref 0.2–1.2)
Bilirubin, Direct: 0.2 mg/dL (ref 0.0–0.3)
Indirect Bilirubin: 0.8 mg/dL (ref 0.2–1.2)
Total Protein: 6.9 g/dL (ref 6.0–8.3)

## 2014-02-23 ENCOUNTER — Encounter (INDEPENDENT_AMBULATORY_CARE_PROVIDER_SITE_OTHER): Payer: Self-pay | Admitting: *Deleted

## 2014-02-24 ENCOUNTER — Telehealth (INDEPENDENT_AMBULATORY_CARE_PROVIDER_SITE_OTHER): Payer: Self-pay | Admitting: *Deleted

## 2014-02-24 DIAGNOSIS — B1920 Unspecified viral hepatitis C without hepatic coma: Secondary | ICD-10-CM

## 2014-02-24 NOTE — Telephone Encounter (Signed)
Per Dr.Rehman the patient will need to have labs drawn in 12 weeks. 

## 2014-03-02 ENCOUNTER — Telehealth (INDEPENDENT_AMBULATORY_CARE_PROVIDER_SITE_OTHER): Payer: Self-pay | Admitting: *Deleted

## 2014-03-02 ENCOUNTER — Encounter (INDEPENDENT_AMBULATORY_CARE_PROVIDER_SITE_OTHER): Payer: Self-pay | Admitting: *Deleted

## 2014-03-02 NOTE — Telephone Encounter (Signed)
I have reviewed with Dr.Rehman all inforamtion. He states that the 16 treatment  is good. [facility]/ [facility] and an email was sent to the patient advising them of this.

## 2014-05-03 ENCOUNTER — Encounter (INDEPENDENT_AMBULATORY_CARE_PROVIDER_SITE_OTHER): Payer: Self-pay | Admitting: *Deleted

## 2014-05-03 ENCOUNTER — Other Ambulatory Visit (INDEPENDENT_AMBULATORY_CARE_PROVIDER_SITE_OTHER): Payer: Self-pay | Admitting: *Deleted

## 2014-05-03 DIAGNOSIS — B1920 Unspecified viral hepatitis C without hepatic coma: Secondary | ICD-10-CM

## 2014-05-12 LAB — HEPATIC FUNCTION PANEL
ALT: 13 U/L (ref 0–35)
AST: 17 U/L (ref 0–37)
Albumin: 3.9 g/dL (ref 3.5–5.2)
Alkaline Phosphatase: 65 U/L (ref 39–117)
BILIRUBIN DIRECT: 0.2 mg/dL (ref 0.0–0.3)
BILIRUBIN TOTAL: 0.7 mg/dL (ref 0.2–1.2)
Indirect Bilirubin: 0.5 mg/dL (ref 0.2–1.2)
Total Protein: 7.2 g/dL (ref 6.0–8.3)

## 2014-05-12 LAB — HEPATITIS C RNA QUANTITATIVE: HCV QUANT: NOT DETECTED [IU]/mL (ref <15)

## 2014-10-20 ENCOUNTER — Other Ambulatory Visit: Payer: Self-pay | Admitting: Obstetrics and Gynecology

## 2014-10-23 LAB — CYTOLOGY - PAP

## 2014-10-26 ENCOUNTER — Encounter (INDEPENDENT_AMBULATORY_CARE_PROVIDER_SITE_OTHER): Payer: Self-pay | Admitting: *Deleted

## 2015-02-13 ENCOUNTER — Encounter (INDEPENDENT_AMBULATORY_CARE_PROVIDER_SITE_OTHER): Payer: Self-pay | Admitting: Internal Medicine

## 2015-02-13 ENCOUNTER — Encounter (INDEPENDENT_AMBULATORY_CARE_PROVIDER_SITE_OTHER): Payer: Self-pay | Admitting: *Deleted

## 2015-02-13 ENCOUNTER — Ambulatory Visit (INDEPENDENT_AMBULATORY_CARE_PROVIDER_SITE_OTHER): Payer: 59 | Admitting: Internal Medicine

## 2015-02-13 VITALS — BP 140/78 | HR 82 | Temp 97.9°F | Resp 18 | Ht 64.0 in | Wt 206.8 lb

## 2015-02-13 DIAGNOSIS — K7400 Hepatic fibrosis, unspecified: Secondary | ICD-10-CM

## 2015-02-13 DIAGNOSIS — Z8719 Personal history of other diseases of the digestive system: Secondary | ICD-10-CM

## 2015-02-13 NOTE — Patient Instructions (Addendum)
Patient will call with results of blood tests and ultrasound when completed

## 2015-02-13 NOTE — Progress Notes (Signed)
Presenting complaint;  Hepatic fibrosis. History of hepatitis C (successfully treated last year).  Subjective:  Patient is 64 year old Caucasian female who is here for scheduled visit. She was last seen over a year ago. She has history of chronic hepatitis C genotype1b who was treated last year with 12 weeks of Harvoni and responded to therapy. HCV RNA by PCR was -3 months after stopping therapy. She previously had been treated with 3 drugs over 3 years ago and did not achieve SVR. She feels fine. She has gained 4 pounds. While she stays busy she does not do regular exercise. She used to walk regularly. She is anxious to have her virus titer checked. He denies abdominal pain melena or rectal bleeding or fatigue. She states heartburns well controlled with therapy.  Current Medications: Outpatient Encounter Prescriptions as of 02/13/2015  Medication Sig  . amLODipine (NORVASC) 5 MG tablet Take 5 mg by mouth daily.   . calcium citrate-vitamin D (CALCIUM + D) 315-200 MG-UNIT per tablet Take by mouth 2 (two) times daily.    Marland Kitchen. lisinopril-hydrochlorothiazide (PRINZIDE,ZESTORETIC) 20-12.5 MG per tablet Take 1 tablet by mouth daily.   . metoprolol (TOPROL-XL) 50 MG 24 hr tablet Take 100 mg by mouth daily.   Marland Kitchen. omeprazole (PRILOSEC OTC) 20 MG tablet Take 20 mg by mouth as needed.  . sucralfate (CARAFATE) 1 G tablet TAKE 2 TABLETS BY MOUTH AT BEDTIME  . omeprazole (PRILOSEC) 20 MG capsule Take 1 capsule (20 mg total) by mouth daily.  . [DISCONTINUED] hydrochlorothiazide (HYDRODIURIL) 25 MG tablet Take 25 mg by mouth daily.  . [DISCONTINUED] Ledipasvir-Sofosbuvir (HARVONI) 90-400 MG TABS Take 1 tablet by mouth daily. Start date 11-09-2013.     Objective: Blood pressure 140/78, pulse 82, temperature 97.9 F (36.6 C), temperature source Oral, resp. rate 18, height 5\' 4"  (1.626 m), weight 206 lb 12.8 oz (93.804 kg). Patient is alert and in no acute distress. Conjunctiva is pink. Sclera is  nonicteric Oropharyngeal mucosa is normal. No neck masses or thyromegaly noted. Cardiac exam with regular rhythm normal S1 and S2. No murmur or gallop noted. Lungs are clear to auscultation. Abdomen is full but soft and nontender without organomegaly or masses. No LE edema or clubbing noted.  Labs/studies Results: HCV RNA by PCR quantitative was undetectable 05/11/2014  LFTs on same date were normal.    Assessment:  #1.  Hepatic fibrosis secondary to chronic hepatitis C. She had liver biopsy in 2008 revealing stage III disease. Recent imaging has failed to show unequivocal changes of cirrhosis. She was treated with Harvoni last year and had SVR. She may also have fatty liver.  Plan:  Upper abdominal ultrasound. CBC and LFTs. HCV RNA by PCR quantitative. AFP. Patient advised to walk or exercise regularly. Office visit in 1 year.

## 2015-02-14 LAB — HEPATIC FUNCTION PANEL
ALT: 13 U/L (ref 0–35)
AST: 15 U/L (ref 0–37)
Albumin: 4.1 g/dL (ref 3.5–5.2)
Alkaline Phosphatase: 69 U/L (ref 39–117)
BILIRUBIN INDIRECT: 0.4 mg/dL (ref 0.2–1.2)
Bilirubin, Direct: 0.1 mg/dL (ref 0.0–0.3)
TOTAL PROTEIN: 6.8 g/dL (ref 6.0–8.3)
Total Bilirubin: 0.5 mg/dL (ref 0.2–1.2)

## 2015-02-14 LAB — CBC
HCT: 37.5 % (ref 36.0–46.0)
Hemoglobin: 11.9 g/dL — ABNORMAL LOW (ref 12.0–15.0)
MCH: 20.7 pg — ABNORMAL LOW (ref 26.0–34.0)
MCHC: 31.7 g/dL (ref 30.0–36.0)
MCV: 65.1 fL — ABNORMAL LOW (ref 78.0–100.0)
Platelets: 192 K/uL (ref 150–400)
RBC: 5.76 MIL/uL — ABNORMAL HIGH (ref 3.87–5.11)
RDW: 16.6 % — ABNORMAL HIGH (ref 11.5–15.5)
WBC: 10.5 K/uL (ref 4.0–10.5)

## 2015-02-14 LAB — AFP TUMOR MARKER: AFP-Tumor Marker: 3 ng/mL (ref <6.1)

## 2015-02-16 LAB — HEPATITIS C RNA QUANTITATIVE: HCV Quantitative: NOT DETECTED IU/mL (ref <15)

## 2015-02-19 ENCOUNTER — Ambulatory Visit (HOSPITAL_COMMUNITY)
Admission: RE | Admit: 2015-02-19 | Discharge: 2015-02-19 | Disposition: A | Discharge status: Home / Self Care | Payer: 59 | Source: Ambulatory Visit | Attending: Internal Medicine | Admitting: Internal Medicine

## 2015-02-19 DIAGNOSIS — K7400 Hepatic fibrosis, unspecified: Secondary | ICD-10-CM

## 2015-06-07 ENCOUNTER — Other Ambulatory Visit: Payer: Self-pay

## 2015-06-07 DIAGNOSIS — K8020 Calculus of gallbladder without cholecystitis without obstruction: Secondary | ICD-10-CM

## 2015-06-07 NOTE — Addendum Note (Signed)
Addended by: Lachae Hohler M on: 06/07/2015 12:13 PM   Modules accepted: Orders  

## 2015-06-26 ENCOUNTER — Ambulatory Visit (INDEPENDENT_AMBULATORY_CARE_PROVIDER_SITE_OTHER): Payer: 59 | Admitting: Internal Medicine

## 2015-06-26 ENCOUNTER — Encounter (INDEPENDENT_AMBULATORY_CARE_PROVIDER_SITE_OTHER): Payer: Self-pay | Admitting: Internal Medicine

## 2015-06-26 VITALS — BP 160/68 | HR 84 | Temp 98.1°F | Ht 64.0 in | Wt 206.2 lb

## 2015-06-26 DIAGNOSIS — R197 Diarrhea, unspecified: Secondary | ICD-10-CM

## 2015-06-26 MED ORDER — DICYCLOMINE HCL 10 MG PO CAPS: 10.0000 mg | ORAL_CAPSULE | Freq: Three times a day (TID) | ORAL | Status: DC

## 2015-06-26 NOTE — Patient Instructions (Signed)
OV in 6 weeks. 

## 2015-06-26 NOTE — Progress Notes (Signed)
Subjective:    Patient ID: [name], female    DOB: [birth year], 64 y.o.   MRN: [id number] [facility].  She saw Dr. Derrell LollingIngram for f/u of her gallstones.  HPI Presents today with c/o diarrhea.  She was last seen by Dr. Karilyn Cotaehman in April. Hx of Hepatitis C and was successfully treated with Harvoni last year. Gneotype 1B.  She was treated with Harvoni x 12 weeks. She has some nausea, diarrhea, and pain rt lower quadrant and occasionally upper rt quadrant. She say the pain is constant (rt lower quadrant).  No nausea today. She says the diarrhea started around the 1st of June. Some weeks it is not as bad as others.  Her BMs are very loose. She is having at least 2 stools a day. Taking Imodium BID.   No recent antibiotics.  She denies any fever. No melena or BRRB.  Her last colonoscopy was in 2007 and was normal.    02/19/2015 US abdomen: surveillance for Hepatitis C IMPRESSION: Cholelithiasis. Gallbladder wall upper normal in thickness.  The liver echogenicity is increased and somewhat inhomogeneous consistent with hepatic steatosis and/or underlying parenchymal disease. While no focal liver lesions are identified, it must be cautioned that the sensitivity of ultrasound for focal liver lesions is diminished significantly given this circumstance.  Study otherwise unremarkable.    CBC    Component Value Date/Time   WBC 10.5 02/13/2015 1638   WBC 5.3 10/05/2008 1209   RBC 5.76* 02/13/2015 1638   RBC 4.90 10/05/2008 1209   RBC 4.37 03/09/2008 1308   HGB 11.9* 02/13/2015 1638   HGB 10.9* 10/05/2008 1209   HCT 37.5 02/13/2015 1638   HCT 34.1* 10/05/2008 1209   PLT 192 02/13/2015 1638   PLT 122* 10/05/2008 1209   MCV 65.1* 02/13/2015 1638   MCV 70* 10/05/2008 1209   MCH 20.7* 02/13/2015 1638   MCH 22.3* 10/05/2008 1209   MCHC 31.7 02/13/2015 1638   MCHC 32.0 10/05/2008 1209   RDW 16.6* 02/13/2015 1638   RDW 17.2*  10/05/2008 1209   LYMPHSABS 2.3 04/20/2013 1150   LYMPHSABS 2.0 10/05/2008 1209   MONOABS 0.5 04/20/2013 1150   EOSABS 0.1 04/20/2013 1150   EOSABS 0.1 10/05/2008 1209   BASOSABS 0.0 04/20/2013 1150   BASOSABS 0.0 10/05/2008 1209    Hepatic Function Panel     Component Value Date/Time   PROT 6.8 02/13/2015 1638   ALBUMIN 4.1 02/13/2015 1638   AST 15 02/13/2015 1638   ALT 13 02/13/2015 1638   ALKPHOS 69 02/13/2015 1638   BILITOT 0.5 02/13/2015 1638   BILIDIR 0.1 02/13/2015 1638   IBILI 0.4 02/13/2015 1638            Review of Systems Past Medical History  Diagnosis Date  . Anemia   . Hepatitis C  . Blood transfusion without reported diagnosis   . GERD (gastroesophageal reflux disease)   . Hypertension     Past Surgical History  Procedure Laterality Date  . Colonoscopy  09/25/06    ROURK  . Upper gastrointestinal endoscopy  07/20/2009  . Upper gastrointestinal endoscopy  09/25/06    ROURK  . Liver biopsy  03/19/07  . Cesarean section  06/30/86  . Tonsillectomy  1960    No Known Allergies  Current Outpatient Prescriptions on File Prior to Visit  Medication Sig Dispense Refill  . amLODipine (NORVASC) 5 MG tablet Take 5 mg by mouth daily.     .Marland Kitchen  calcium citrate-vitamin D (CALCIUM + D) 315-200 MG-UNIT per tablet Take by mouth 2 (two) times daily.      Marland Kitchen lisinopril-hydrochlorothiazide (PRINZIDE,ZESTORETIC) 20-12.5 MG per tablet Take 1 tablet by mouth daily.   12  . metoprolol (TOPROL-XL) 50 MG 24 hr tablet Take 100 mg by mouth daily.     Marland Kitchen omeprazole (PRILOSEC OTC) 20 MG tablet Take 20 mg by mouth as needed.    Marland Kitchen omeprazole (PRILOSEC) 20 MG capsule Take 1 capsule (20 mg total) by mouth daily. 20 capsule 4   Current Facility-Administered Medications on File Prior to Visit  Medication Dose Route Frequency Provider Last Rate Last Dose  . 0.9 %  sodium chloride infusion   Intravenous Continuous Malissa Hippo, MD             Objective:   Physical Exam Blood  pressure 160/68, pulse 84, temperature 98.1 F (36.7 C), height  (1.626 m), weight 206 lb 3.2 oz (93.532 kg).  Alert and oriented. Skin warm and dry. Oral mucosa is moist.   . Sclera anicteric, conjunctivae is pink. Thyroid not enlarged. No cervical lymphadenopathy. Lungs clear. Heart regular rate and rhythm.  Abdomen is soft. Bowel sounds are positive. No hepatomegaly. No abdominal masses felt. No tenderness.  No edema to lower extremities.         Assessment & Plan:  Diarrhea. ? Etiology. Stool studies. Infectious process needs to be ruled out. Stool studies. Dicyclomine   TID. OV in 6 weeks. If not better, will schedule a colonoscopy. Discussed with Dr Karilyn Cota. Last colonoscopy in 2007.

## 2015-06-28 ENCOUNTER — Encounter (INDEPENDENT_AMBULATORY_CARE_PROVIDER_SITE_OTHER): Payer: Self-pay | Admitting: *Deleted

## 2015-08-06 ENCOUNTER — Encounter (INDEPENDENT_AMBULATORY_CARE_PROVIDER_SITE_OTHER): Payer: Self-pay | Admitting: Internal Medicine

## 2015-08-06 ENCOUNTER — Telehealth (INDEPENDENT_AMBULATORY_CARE_PROVIDER_SITE_OTHER): Payer: Self-pay | Admitting: *Deleted

## 2015-08-06 NOTE — Telephone Encounter (Signed)
I need to cancel my appointment on Wednesday, August 31st, at 3:00 pm.  I do need to reschedule, so please call at your convenience.        Thank you        [name]     Send via patient response.

## 2015-08-07 ENCOUNTER — Ambulatory Visit (INDEPENDENT_AMBULATORY_CARE_PROVIDER_SITE_OTHER): Payer: 59 | Admitting: Internal Medicine

## 2015-08-08 ENCOUNTER — Ambulatory Visit (INDEPENDENT_AMBULATORY_CARE_PROVIDER_SITE_OTHER): Payer: 59 | Admitting: Internal Medicine

## 2015-08-15 NOTE — Telephone Encounter (Signed)
LM for patient to return the call.  

## 2015-08-21 LAB — GASTROINTESTINAL PATHOGEN PANEL PCR
C. difficile Tox A/B, PCR: POSITIVE — CR
Campylobacter, PCR: NEGATIVE
Cryptosporidium, PCR: NEGATIVE
E COLI (ETEC) LT/ST, PCR: NEGATIVE
E coli (STEC) stx1/stx2, PCR: NEGATIVE
E coli 0157, PCR: NEGATIVE
GIARDIA LAMBLIA, PCR: NEGATIVE
NOROVIRUS, PCR: NEGATIVE
ROTAVIRUS, PCR: NEGATIVE
Salmonella, PCR: NEGATIVE
Shigella, PCR: NEGATIVE

## 2015-08-22 ENCOUNTER — Other Ambulatory Visit (INDEPENDENT_AMBULATORY_CARE_PROVIDER_SITE_OTHER): Payer: Self-pay | Admitting: Internal Medicine

## 2015-08-22 DIAGNOSIS — R197 Diarrhea, unspecified: Secondary | ICD-10-CM

## 2015-08-22 MED ORDER — METRONIDAZOLE 500 MG PO TABS: 500.0000 mg | ORAL_TABLET | Freq: Three times a day (TID) | ORAL | Status: DC

## 2015-08-23 NOTE — Telephone Encounter (Signed)
Apt has been scheduled for 09/03/15 with Dorene Ar, NP.

## 2015-08-23 NOTE — Telephone Encounter (Signed)
LM for patient to return the call.  

## 2015-09-03 ENCOUNTER — Encounter (INDEPENDENT_AMBULATORY_CARE_PROVIDER_SITE_OTHER): Payer: Self-pay | Admitting: Internal Medicine

## 2015-09-03 ENCOUNTER — Ambulatory Visit (INDEPENDENT_AMBULATORY_CARE_PROVIDER_SITE_OTHER): Payer: 59 | Admitting: Internal Medicine

## 2015-09-03 VITALS — BP 156/68 | HR 70 | Temp 97.7°F | Ht 65.0 in | Wt 207.2 lb

## 2015-09-03 DIAGNOSIS — B1920 Unspecified viral hepatitis C without hepatic coma: Secondary | ICD-10-CM | POA: Diagnosis not present

## 2015-09-03 DIAGNOSIS — K7460 Unspecified cirrhosis of liver: Secondary | ICD-10-CM | POA: Diagnosis not present

## 2015-09-03 DIAGNOSIS — R1031 Right lower quadrant pain: Secondary | ICD-10-CM | POA: Diagnosis not present

## 2015-09-03 NOTE — Progress Notes (Addendum)
Subjective:    Patient ID: [name], female    DOB: 1951/11/13, 64 y.o.   MRN: [id number]  HPI Here today for f/u of her C-diff.  She is having some diarrhea but is much better.  She was seen in July and found to have C-diff. She was covered with Flagyl  TID x 10 days. She is having about 3 stools a day and are formed. She fells 80% better.  She does c/o some rt side abdominal pain for the last few months. Sometimes this will wake her up during the night. The pain will come and go. It is not constant.  Her last GYN exam was in March and was normal. Hx of Hepatitis C. She is Genotype 1B. She failed tx x 2. She was treated by Dr. Karilyn Cota wit Pegasys, ribavirin, and Telepravir in 2012 and failed tx.  She was treated with Harvoni in 2014 and cleared the virus.  Her appetite is good. No weight loss.   Her last colonoscopy was in 2007 and was normal.  02/20/2015 US abdomen:   IMPRESSION: Cholelithiasis. Gallbladder wall upper normal in thickness.  The liver echogenicity is increased and somewhat inhomogeneous consistent with hepatic steatosis and/or underlying parenchymal disease. While no focal liver lesions are identified, it must be cautioned that the sensitivity of ultrasound for focal liver lesions is diminished significantly given this circumstance.  Study otherwise unremarkable. Review of Systems    Past Medical History  Diagnosis Date  . Anemia   . Hepatitis C  . Blood transfusion without reported diagnosis   . GERD (gastroesophageal reflux disease)   . Hypertension     Past Surgical History  Procedure Laterality Date  . Colonoscopy  09/25/06    ROURK  . Upper gastrointestinal endoscopy  07/20/2009  . Upper gastrointestinal endoscopy  09/25/06    ROURK  . Liver biopsy  03/19/07  . Cesarean section  06/30/86  . Tonsillectomy  1960    No Known Allergies  Current Outpatient Prescriptions on File Prior to Visit  Medication Sig Dispense Refill  .  amLODipine (NORVASC) 5 MG tablet Take 10 mg by mouth daily.     . calcium citrate-vitamin D (CALCIUM + D) 315-200 MG-UNIT per tablet Take by mouth 2 (two) times daily.      . metroNIDAZOLE (FLAGYL) 500 MG tablet Take 1 tablet (500 mg total) by mouth 3 (three) times daily. 30 tablet 0  . dicyclomine (BENTYL) 10 MG capsule Take 1 capsule (10 mg total) by mouth 3 (three) times daily. (Patient not taking: Reported on 09/03/2015) 90 capsule 3  . lisinopril-hydrochlorothiazide (PRINZIDE,ZESTORETIC) 20-12.5 MG per tablet Take 1 tablet by mouth daily.   12  . metoprolol (TOPROL-XL) 50 MG 24 hr tablet Take 100 mg by mouth daily.     Marland Kitchen omeprazole (PRILOSEC OTC) 20 MG tablet Take 20 mg by mouth as needed.    Marland Kitchen omeprazole (PRILOSEC) 20 MG capsule Take 1 capsule (20 mg total) by mouth daily. 20 capsule 4   Current Facility-Administered Medications on File Prior to Visit  Medication Dose Route Frequency Provider Last Rate Last Dose  . 0.9 %  sodium chloride infusion   Intravenous Continuous Malissa Hippo, MD            Objective:   Physical Exam Blood pressure 156/68, pulse 70, temperature 97.7 F (36.5 C), height  (1.651 m), weight 207 lb 3.2 oz (93.985 kg). Alert and oriented. Skin warm and dry. Oral mucosa is  moist.   . Sclera anicteric, conjunctivae is pink. Thyroid not enlarged. No cervical lymphadenopathy. Lungs clear. Heart regular rate and rhythm.  Abdomen is soft. Bowel sounds are positive. No hepatomegaly. No abdominal masses felt. No tenderness.  No edema to lower extremities.           Assessment & Plan:  C-diff. She is doing better. She is 80% better . Stools are firmer. She does c/o rt lower quadrant pain off and on which is not constant. Hx of Hepatitis C. Will get a CT abdomen/pelvis with CM.

## 2015-09-03 NOTE — Patient Instructions (Addendum)
CT abdomen/pelvis with CM. OV in 6 months.  

## 2015-09-04 LAB — CREATININE, SERUM: Creat: 0.67 mg/dL (ref 0.50–0.99)

## 2015-09-06 ENCOUNTER — Ambulatory Visit (HOSPITAL_COMMUNITY): Payer: 59

## 2015-09-12 ENCOUNTER — Ambulatory Visit (HOSPITAL_COMMUNITY)
Admission: RE | Admit: 2015-09-12 | Discharge: 2015-09-12 | Disposition: A | Discharge status: Home / Self Care | Payer: 59 | Source: Ambulatory Visit | Attending: Internal Medicine | Admitting: Internal Medicine

## 2015-09-12 DIAGNOSIS — R1031 Right lower quadrant pain: Secondary | ICD-10-CM | POA: Diagnosis not present

## 2015-09-12 DIAGNOSIS — K8020 Calculus of gallbladder without cholecystitis without obstruction: Secondary | ICD-10-CM | POA: Insufficient documentation

## 2015-09-12 DIAGNOSIS — R590 Localized enlarged lymph nodes: Secondary | ICD-10-CM | POA: Insufficient documentation

## 2015-09-12 DIAGNOSIS — R918 Other nonspecific abnormal finding of lung field: Secondary | ICD-10-CM | POA: Diagnosis not present

## 2015-09-12 DIAGNOSIS — B1920 Unspecified viral hepatitis C without hepatic coma: Secondary | ICD-10-CM | POA: Insufficient documentation

## 2015-09-12 DIAGNOSIS — R197 Diarrhea, unspecified: Secondary | ICD-10-CM | POA: Insufficient documentation

## 2015-09-12 DIAGNOSIS — K7460 Unspecified cirrhosis of liver: Secondary | ICD-10-CM

## 2015-09-12 MED ORDER — IOHEXOL 300 MG/ML  SOLN
100.0000 mL | Freq: Once | INTRAMUSCULAR | Status: AC | PRN
  Administered 2015-09-12: 100 mL via INTRAVENOUS

## 2015-12-14 ENCOUNTER — Encounter (INDEPENDENT_AMBULATORY_CARE_PROVIDER_SITE_OTHER): Payer: Self-pay | Admitting: *Deleted

## 2016-02-13 ENCOUNTER — Ambulatory Visit (INDEPENDENT_AMBULATORY_CARE_PROVIDER_SITE_OTHER): Payer: 59 | Admitting: Internal Medicine

## 2016-02-21 ENCOUNTER — Encounter (INDEPENDENT_AMBULATORY_CARE_PROVIDER_SITE_OTHER): Payer: Self-pay | Admitting: *Deleted

## 2016-02-22 ENCOUNTER — Other Ambulatory Visit (INDEPENDENT_AMBULATORY_CARE_PROVIDER_SITE_OTHER): Payer: Self-pay | Admitting: Internal Medicine

## 2016-02-22 DIAGNOSIS — K7469 Other cirrhosis of liver: Secondary | ICD-10-CM

## 2016-02-22 DIAGNOSIS — B1920 Unspecified viral hepatitis C without hepatic coma: Secondary | ICD-10-CM

## 2016-03-03 ENCOUNTER — Ambulatory Visit (HOSPITAL_COMMUNITY)
Admission: RE | Admit: 2016-03-03 | Discharge: 2016-03-03 | Disposition: A | Discharge status: Home / Self Care | Payer: 59 | Source: Ambulatory Visit | Attending: Internal Medicine | Admitting: Internal Medicine

## 2016-03-03 DIAGNOSIS — R161 Splenomegaly, not elsewhere classified: Secondary | ICD-10-CM | POA: Insufficient documentation

## 2016-03-03 DIAGNOSIS — B1920 Unspecified viral hepatitis C without hepatic coma: Secondary | ICD-10-CM | POA: Insufficient documentation

## 2016-03-03 DIAGNOSIS — K8020 Calculus of gallbladder without cholecystitis without obstruction: Secondary | ICD-10-CM | POA: Diagnosis not present

## 2016-03-03 DIAGNOSIS — K760 Fatty (change of) liver, not elsewhere classified: Secondary | ICD-10-CM | POA: Diagnosis not present

## 2016-03-11 ENCOUNTER — Encounter (INDEPENDENT_AMBULATORY_CARE_PROVIDER_SITE_OTHER): Payer: Self-pay | Admitting: Internal Medicine

## 2016-03-11 ENCOUNTER — Ambulatory Visit (INDEPENDENT_AMBULATORY_CARE_PROVIDER_SITE_OTHER): Payer: 59 | Admitting: Internal Medicine

## 2016-03-11 VITALS — BP 152/92 | HR 74 | Temp 99.0°F | Resp 18 | Ht 65.0 in | Wt 208.8 lb

## 2016-03-11 DIAGNOSIS — K760 Fatty (change of) liver, not elsewhere classified: Secondary | ICD-10-CM

## 2016-03-11 DIAGNOSIS — K8020 Calculus of gallbladder without cholecystitis without obstruction: Secondary | ICD-10-CM | POA: Diagnosis not present

## 2016-03-11 DIAGNOSIS — K7469 Other cirrhosis of liver: Secondary | ICD-10-CM | POA: Diagnosis not present

## 2016-03-11 NOTE — Patient Instructions (Addendum)
Physician will call with results of AFP when completed. Next ultrasound and AFP would be in 6 months. Increase physical activity or walking as much as possible as discussed.

## 2016-03-11 NOTE — Progress Notes (Signed)
Presenting complaint;  Follow-up for cirrhosis. History of hepatitis C(treated with SVR)  Subjective:  Patient is 65 year old Caucasian female who is here for scheduled visit. She was last seen in September 2016. She has no complaints. She works 40 hours a week. She stays busy but she does not do any exercise on regular basis. She states her husband is disabled and wheelchair bound secondary to MS so when she gets home she has to do not of things. She denies abdominal pain diarrhea constipation melena or rectal bleeding. She says omeprazole is working.     Current Medications: Outpatient Encounter Prescriptions as of 03/11/2016  Medication Sig  . amLODipine (NORVASC) 5 MG tablet Take 10 mg by mouth daily.   . calcium citrate-vitamin D (CALCIUM + D) 315-200 MG-UNIT per tablet Take by mouth 2 (two) times daily.    Marland Kitchen. LORazepam (ATIVAN) 1 MG tablet TAKE 1 TABLET BY MOUTH 2 TIMES DAILY AS NEEDED  . metoprolol (TOPROL-XL) 50 MG 24 hr tablet Take 100 mg by mouth daily.   Marland Kitchen. omeprazole (PRILOSEC OTC) 20 MG tablet Take 20 mg by mouth as needed.  Marland Kitchen. omeprazole (PRILOSEC) 20 MG capsule Take 1 capsule (20 mg total) by mouth daily.  . [DISCONTINUED] dicyclomine (BENTYL) 10 MG capsule Take 1 capsule (10 mg total) by mouth 3 (three) times daily. (Patient not taking: Reported on 09/03/2015)  . [DISCONTINUED] lisinopril-hydrochlorothiazide (PRINZIDE,ZESTORETIC) 20-12.5 MG per tablet Take 1 tablet by mouth daily. Reported on 03/11/2016  . [DISCONTINUED] metroNIDAZOLE (FLAGYL) 500 MG tablet Take 1 tablet (500 mg total) by mouth 3 (three) times daily. (Patient not taking: Reported on 03/11/2016)   Facility-Administered Encounter Medications as of 03/11/2016  Medication  . 0.9 %  sodium chloride infusion     Objective: Blood pressure 152/92, pulse 74, temperature 99 F (37.2 C), temperature source Oral, resp. rate 18, height 5\' 5"  (1.651 m), weight 208 lb 12.8 oz (94.711 kg). Patient is alert and in no acute  distress. Conjunctiva is pink. Sclera is nonicteric Oropharyngeal mucosa is normal. No neck masses or thyromegaly noted. Cardiac exam with regular rhythm normal S1 and S2. No murmur or gallop noted. Lungs are clear to auscultation. Abdomen is full but soft and nontender without organomegaly or masses. No LE edema or clubbing noted.  Labs/studies Results: Lab data from 10/24/2015  WBC 10.3, H&H 12.3 and 37.9 and MCV 65 . Platelet count 221K  Bilirubin 0.6, AP 75, AST 15, ALT 14, total protein 6.7 and albumin 4.1.  Glucose 110, creatinine 0.82.  Serum calcium 8.7.   Ultrasound and elastography from 03/03/2016   echogenic liver consistent with hepatic steatosis cholelithiasis and splenomegaly Median hepatic Shearer wave velocity is 3.9 to m/sec corresponding to Metavir fibrosis score of after 3 and F4.  Assessment:  #1. Cirrhosis secondary to hepatitis C which was successfully treated with SVR. Transaminases remain normal. She has well compensated hepatic function. [facility] as risk is significant despite HCV clearance. #2. Fatty liver. This could become an issue down the road. She therefore needs to increase physical activity. #3. Asymptomatic cholelithiasis.   Plan:  Patient encouraged to increase physical activity which would help improve fatty liver. She will go to the lab for AFP. She will have AFP and liver ultrasound in 6 months. Office visit in one year.

## 2016-03-12 ENCOUNTER — Telehealth (INDEPENDENT_AMBULATORY_CARE_PROVIDER_SITE_OTHER): Payer: Self-pay | Admitting: *Deleted

## 2016-03-12 DIAGNOSIS — K7469 Other cirrhosis of liver: Secondary | ICD-10-CM

## 2016-03-12 LAB — AFP TUMOR MARKER: AFP-Tumor Marker: 2.8 ng/mL (ref <6.1)

## 2016-03-12 NOTE — Telephone Encounter (Signed)
Per Dr.Rehman the patient will need to have labs drawn in 6 months. 

## 2016-09-01 ENCOUNTER — Other Ambulatory Visit (INDEPENDENT_AMBULATORY_CARE_PROVIDER_SITE_OTHER): Payer: Self-pay | Admitting: *Deleted

## 2016-09-01 ENCOUNTER — Encounter (INDEPENDENT_AMBULATORY_CARE_PROVIDER_SITE_OTHER): Payer: Self-pay | Admitting: *Deleted

## 2016-09-01 DIAGNOSIS — K7469 Other cirrhosis of liver: Secondary | ICD-10-CM

## 2016-09-10 LAB — AFP TUMOR MARKER: AFP TUMOR MARKER: 3.1 ng/mL (ref <6.1)

## 2016-09-12 ENCOUNTER — Other Ambulatory Visit (INDEPENDENT_AMBULATORY_CARE_PROVIDER_SITE_OTHER): Payer: Self-pay | Admitting: *Deleted

## 2016-09-12 DIAGNOSIS — R7989 Other specified abnormal findings of blood chemistry: Secondary | ICD-10-CM

## 2016-09-12 DIAGNOSIS — R945 Abnormal results of liver function studies: Secondary | ICD-10-CM

## 2016-09-12 DIAGNOSIS — Z8619 Personal history of other infectious and parasitic diseases: Secondary | ICD-10-CM

## 2016-09-16 ENCOUNTER — Encounter (INDEPENDENT_AMBULATORY_CARE_PROVIDER_SITE_OTHER): Payer: Self-pay | Admitting: Internal Medicine

## 2016-09-17 ENCOUNTER — Other Ambulatory Visit (INDEPENDENT_AMBULATORY_CARE_PROVIDER_SITE_OTHER): Payer: Self-pay | Admitting: *Deleted

## 2016-09-17 DIAGNOSIS — B182 Chronic viral hepatitis C: Secondary | ICD-10-CM

## 2016-09-26 ENCOUNTER — Ambulatory Visit (HOSPITAL_COMMUNITY): Payer: 59

## 2016-09-29 ENCOUNTER — Ambulatory Visit (HOSPITAL_COMMUNITY): Admission: RE | Admit: 2016-09-29 | Payer: 59 | Source: Ambulatory Visit

## 2016-10-06 ENCOUNTER — Other Ambulatory Visit (HOSPITAL_COMMUNITY): Payer: Self-pay | Admitting: Foot & Ankle Surgery

## 2016-10-06 DIAGNOSIS — R0989 Other specified symptoms and signs involving the circulatory and respiratory systems: Secondary | ICD-10-CM

## 2016-10-06 DIAGNOSIS — I739 Peripheral vascular disease, unspecified: Secondary | ICD-10-CM

## 2016-10-08 ENCOUNTER — Ambulatory Visit (HOSPITAL_COMMUNITY): Payer: 59

## 2016-10-08 ENCOUNTER — Ambulatory Visit (HOSPITAL_COMMUNITY)
Admission: RE | Admit: 2016-10-08 | Discharge: 2016-10-08 | Disposition: A | Discharge status: Home / Self Care | Payer: 59 | Source: Ambulatory Visit | Attending: Cardiovascular Disease | Admitting: Cardiovascular Disease

## 2016-10-08 DIAGNOSIS — R0989 Other specified symptoms and signs involving the circulatory and respiratory systems: Secondary | ICD-10-CM | POA: Diagnosis not present

## 2016-10-08 DIAGNOSIS — I739 Peripheral vascular disease, unspecified: Secondary | ICD-10-CM | POA: Diagnosis not present

## 2016-10-14 ENCOUNTER — Ambulatory Visit (HOSPITAL_COMMUNITY)
Admission: RE | Admit: 2016-10-14 | Discharge: 2016-10-14 | Disposition: A | Discharge status: Home / Self Care | Payer: 59 | Source: Ambulatory Visit | Attending: Internal Medicine | Admitting: Internal Medicine

## 2016-10-14 DIAGNOSIS — K8020 Calculus of gallbladder without cholecystitis without obstruction: Secondary | ICD-10-CM | POA: Insufficient documentation

## 2016-10-14 DIAGNOSIS — B182 Chronic viral hepatitis C: Secondary | ICD-10-CM | POA: Insufficient documentation

## 2016-11-06 ENCOUNTER — Ambulatory Visit (HOSPITAL_COMMUNITY)
Admission: EM | Admit: 2016-11-06 | Discharge: 2016-11-06 | Disposition: A | Discharge status: Home / Self Care | Payer: 59 | Attending: Family Medicine | Admitting: Family Medicine

## 2016-11-06 ENCOUNTER — Encounter (HOSPITAL_COMMUNITY): Payer: Self-pay | Admitting: Emergency Medicine

## 2016-11-06 DIAGNOSIS — R0789 Other chest pain: Secondary | ICD-10-CM

## 2016-11-06 MED ORDER — NAPROXEN 375 MG PO TABS: 375.0000 mg | ORAL_TABLET | Freq: Two times a day (BID) | ORAL | 0 refills | Status: DC

## 2016-11-06 NOTE — Discharge Instructions (Signed)
Your EKG appears to be normal. He did have reproducible tenderness over the left front part of your chest. This indicates that this is pain within the wall of your chest and not your lungs or heart. Apply ice over the area off and on for the next couple of days. He may then switch to heat. Limit the use of your left arm, particularly in regards to raising the arm or lifting or pulling. He developed heaviness, crushing sensation, nausea, vomiting, shortness of breath, sweating or other symptoms of actual heart or lung problems go to emergency department promptly.

## 2016-11-06 NOTE — ED Triage Notes (Signed)
Here for left sided CP onset that woke her up yest am.   Believes it might be due to loading wheelchair into car 3 days ago  Pain increases w/activity  Denies SOB, HA, blurred vision, diaphoresis, n/v  A&O x4... NAD

## 2016-11-06 NOTE — ED Provider Notes (Signed)
CSN: [id number]     Arrival date & time 11/06/16  1109 History   First MD Initiated Contact with Patient 11/06/16 1235     Chief Complaint  Patient presents with  . Chest Pain   (Consider location/radiation/quality/duration/timing/severity/associated sxs/prior Treatment) 65 year old female stating that yesterday she noticed she was having left anterior chest pain radiating into the left proximal arm. It was worse with movement of the arm and taking a deep breath. She describes it as a shooting quick pain as well as a tight pain in the front of the left chest. It is exacerbated or elicited with movement of the arm. No associated shortness of breath, DOE or orthopnea. No diaphoresis, nausea or other symptoms. She does remember after much questioning that she did try to move her husband a couple days ago as he is in a wheelchair. Patient has no cardiac history. She does smoke and has hypertension.      Past Medical History:  Diagnosis Date  . Anemia   . Blood transfusion without reported diagnosis   . GERD (gastroesophageal reflux disease)   . Hepatitis C  . Hypertension    Past Surgical History:  Procedure Laterality Date  . CESAREAN SECTION  06/30/86  . COLONOSCOPY  09/25/06   ROURK  . LIVER BIOPSY  03/19/07  . TONSILLECTOMY  1960  . UPPER GASTROINTESTINAL ENDOSCOPY  07/20/2009  . UPPER GASTROINTESTINAL ENDOSCOPY  09/25/06   ROURK   Family History  Problem Relation Age of Onset  . Heart disease Mother   . Heart disease Father   . Cancer Father     lung  . Healthy Sister   . Healthy Daughter   . Healthy Daughter   . Healthy Son    Social History  Substance Use Topics  . Smoking status: Current Every Day Smoker    Packs/day: 1.00    Types: Cigarettes    Last attempt to quit: 10/08/2012  . Smokeless tobacco: Never Used  . Alcohol use No   OB History    No data available     Review of Systems  Constitutional: Negative.   HENT: Negative.   Respiratory: Negative.   Negative for cough, shortness of breath and wheezing.   Cardiovascular: Positive for chest pain. Negative for palpitations and leg swelling.  Gastrointestinal: Negative.   Genitourinary: Negative.   Musculoskeletal: Negative for arthralgias, joint swelling, neck pain and neck stiffness.  Skin: Negative.   Neurological: Negative.   All other systems reviewed and are negative.   Allergies  Patient has no known allergies.  Home Medications   Prior to Admission medications   Medication Sig Start Date End Date Taking? Authorizing Provider  amLODipine (NORVASC) 5 MG tablet Take 10 mg by mouth daily.  02/08/15  Yes Historical Provider, MD  calcium citrate-vitamin D (CALCIUM + D) 315-200 MG-UNIT per tablet Take by mouth 2 (two) times daily.     Yes Historical Provider, MD  metoprolol (TOPROL-XL) 50 MG 24 hr tablet Take 100 mg by mouth daily.    Yes Historical Provider, MD  LORazepam (ATIVAN) 1 MG tablet TAKE 1 TABLET BY MOUTH 2 TIMES DAILY AS NEEDED 02/13/16   Historical Provider, MD  omeprazole (PRILOSEC OTC) 20 MG tablet Take 20 mg by mouth as needed.    Historical Provider, MD   Meds Ordered and Administered this Visit  Medications - No data to display  BP 143/72 (BP Location: Left Arm)   Pulse 78   Temp 98.4 F (36.9 C) (Oral)  Resp 18   SpO2 99%  No data found.   Physical Exam  Constitutional: She is oriented to person, place, and time. She appears well-developed and well-nourished. No distress.  HENT:  Head: Normocephalic and atraumatic.  Neck: Normal range of motion. Neck supple.  Bilateral carotids 2+. No bruits.  Cardiovascular: Normal rate, regular rhythm, normal heart sounds and intact distal pulses.  Exam reveals no gallop.   No murmur heard. Pulmonary/Chest: Effort normal and breath sounds normal. No respiratory distress. She has no wheezes. She has no rales. She exhibits tenderness.  Reproducible left anterior chest wall tenderness including the left parasternal borders  and the chest wall at the left anterior axillary line. Patient states palpation of these areas reproduces the same pain for which she has been having and presents to the urgent care today.  Musculoskeletal: Normal range of motion. She exhibits no edema or deformity.  Lymphadenopathy:    She has no cervical adenopathy.  Neurological: She is alert and oriented to person, place, and time. No cranial nerve deficit.  Skin: Skin is warm and dry. Capillary refill takes less than 2 seconds.  Psychiatric: She has a normal mood and affect.  Nursing note and vitals reviewed.   Urgent Care Course   Clinical Course   ED ECG REPORT   Date: 11/06/2016  Rate:   Rhythm: normal sinus rhythm  QRS Axis: normal  Intervals: normal  ST/T Wave abnormalities: normal  Conduction Disutrbances:none  Narrative Interpretation:   Old EKG Reviewed: none available  I have personally reviewed the EKG tracing and agree with the computerized printout as noted.   Procedures (including critical care time)  Labs Review Labs Reviewed - No data to display  Imaging Review No results found.   Visual Acuity Review  Right Eye Distance:   Left Eye Distance:   Bilateral Distance:    Right Eye Near:   Left Eye Near:    Bilateral Near:         MDM   1. Chest wall pain    Your EKG appears to be normal. He did have reproducible tenderness over the left front part of your chest. This indicates that this is pain within the wall of your chest and not your lungs or heart. Apply ice over the area off and on for the next couple of days. He may then switch to heat. Limit the use of your left arm, particularly in regards to raising the arm or lifting or pulling. He developed heaviness, crushing sensation, nausea, vomiting, shortness of breath, sweating or other symptoms of actual heart or lung problems go to emergency department promptly. Meds ordered this encounter  Medications  . naproxen (NAPROSYN) 375 MG tablet     Sig: Take 1 tablet (375 mg total) by mouth 2 (two) times daily. Take with food.    Dispense:  20 tablet    Refill:  0    Order Specific Question:   Supervising Provider    Answer:   Micheline ChapmanHONIG, ERIN J [[id number]]       Hayden Rasmussenavid Catharine Kettlewell, NP 11/06/16 1401    Hayden Rasmussenavid Cheral Cappucci, NP 11/06/16 [phone]

## 2017-02-18 ENCOUNTER — Encounter (INDEPENDENT_AMBULATORY_CARE_PROVIDER_SITE_OTHER): Payer: Self-pay | Admitting: Internal Medicine

## 2017-03-02 ENCOUNTER — Other Ambulatory Visit (INDEPENDENT_AMBULATORY_CARE_PROVIDER_SITE_OTHER): Payer: Self-pay | Admitting: *Deleted

## 2017-03-02 ENCOUNTER — Encounter (INDEPENDENT_AMBULATORY_CARE_PROVIDER_SITE_OTHER): Payer: Self-pay | Admitting: *Deleted

## 2017-03-02 DIAGNOSIS — R7989 Other specified abnormal findings of blood chemistry: Secondary | ICD-10-CM

## 2017-03-02 DIAGNOSIS — R945 Abnormal results of liver function studies: Secondary | ICD-10-CM

## 2017-03-02 DIAGNOSIS — Z8619 Personal history of other infectious and parasitic diseases: Secondary | ICD-10-CM

## 2017-03-11 ENCOUNTER — Encounter (INDEPENDENT_AMBULATORY_CARE_PROVIDER_SITE_OTHER): Payer: Self-pay | Admitting: Internal Medicine

## 2017-03-11 ENCOUNTER — Ambulatory Visit (INDEPENDENT_AMBULATORY_CARE_PROVIDER_SITE_OTHER): Payer: 59 | Admitting: Internal Medicine

## 2017-03-11 ENCOUNTER — Other Ambulatory Visit (INDEPENDENT_AMBULATORY_CARE_PROVIDER_SITE_OTHER): Payer: Self-pay | Admitting: Internal Medicine

## 2017-03-11 VITALS — BP 130/80 | HR 72 | Temp 97.9°F | Ht 65.0 in | Wt 193.6 lb

## 2017-03-11 DIAGNOSIS — B182 Chronic viral hepatitis C: Secondary | ICD-10-CM

## 2017-03-11 DIAGNOSIS — K7469 Other cirrhosis of liver: Secondary | ICD-10-CM | POA: Diagnosis not present

## 2017-03-11 NOTE — Progress Notes (Signed)
   Subjective:    Patient ID: [name], female    DOB: 12-Oct-1951, 65 y.o.   MRN: [id number]  HPI Here today for f/u. She was last seen in April of 2017 by Dr. Karilyn Cota. Hx of cirrhosis secondary to Hepatitis C.  She was successfully treated for Hepatitis C in 2015 with Harvoni. Genotype 1A. She tells me she is doing good. Her appetite is good. She has intentional weight loss. She has a BM every day. No GI complaints.    10/3//2017 AFP 3.1  10/14/2016 US abdomen:  IMPRESSION: Cholelithiasis without biliary dilatation Markedly echogenic liver which is difficult to penetrate image by ultrasound. Spleen upper normal.    Ultrasound and elastography from 03/03/2016   echogenic liver consistent with hepatic steatosis cholelithiasis and splenomegaly Median hepatic Shearer wave velocity is 3.9 to m/sec corresponding to Metavir fibrosis score of after 3 and F4.   Review of Systems Past Medical History:  Diagnosis Date  . Anemia   . Blood transfusion without reported diagnosis   . GERD (gastroesophageal reflux disease)   . Hepatitis C  . Hypertension     Past Surgical History:  Procedure Laterality Date  . CESAREAN SECTION  06/30/86  . COLONOSCOPY  09/25/06   ROURK  . LIVER BIOPSY  03/19/07  . TONSILLECTOMY  1960  . UPPER GASTROINTESTINAL ENDOSCOPY  07/20/2009  . UPPER GASTROINTESTINAL ENDOSCOPY  09/25/06   ROURK    No Known Allergies  Current Outpatient Prescriptions on File Prior to Visit  Medication Sig Dispense Refill  . amLODipine (NORVASC) 5 MG tablet Take 10 mg by mouth daily.     . calcium citrate-vitamin D (CALCIUM + D) 315-200 MG-UNIT per tablet Take by mouth 2 (two) times daily.      Marland Kitchen LORazepam (ATIVAN) 1 MG tablet TAKE 1 TABLET BY MOUTH 2 TIMES DAILY AS NEEDED  5  . metoprolol (TOPROL-XL) 50 MG 24 hr tablet Take 100 mg by mouth daily.     Marland Kitchen omeprazole (PRILOSEC OTC) 20 MG tablet Take 20 mg by mouth as needed.     Current Facility-Administered  Medications on File Prior to Visit  Medication Dose Route Frequency Provider Last Rate Last Dose  . 0.9 %  sodium chloride infusion   Intravenous Continuous Malissa Hippo, MD           Objective:   Physical Exam Blood pressure 134/82, pulse 72, temperature 97.9 F (36.6 C), height  (1.651 m), weight 193 lb 9.6 oz (87.8 kg).Alert and oriented. Skin warm and dry. Oral mucosa is moist.   . Sclera anicteric, conjunctivae is pink. Thyroid not enlarged. No cervical lymphadenopathy. Lungs clear. Heart regular rate and rhythm.  Abdomen is soft. Bowel sounds are positive. No hepatomegaly. No abdominal masses felt. No tenderness.  No edema to lower extremities.           Assessment & Plan:  Cirrhosis secondary to Hepatitis C.  Needs CBC, Hepatic. AFP. She is on a recall for [place]. AFP.

## 2017-03-11 NOTE — Patient Instructions (Signed)
OV in 1 year.  

## 2017-03-12 LAB — CBC WITH DIFFERENTIAL/PLATELET
Basophils Absolute: 0 cells/uL (ref 0–200)
Basophils Relative: 0 %
EOS ABS: 236 {cells}/uL (ref 15–500)
Eosinophils Relative: 2 %
HCT: 39.1 % (ref 35.0–45.0)
Hemoglobin: 12.3 g/dL (ref 11.7–15.5)
LYMPHS ABS: 3068 {cells}/uL (ref 850–3900)
Lymphocytes Relative: 26 %
MCH: 20.6 pg — ABNORMAL LOW (ref 27.0–33.0)
MCHC: 31.5 g/dL — ABNORMAL LOW (ref 32.0–36.0)
MCV: 65.4 fL — AB (ref 80.0–100.0)
MONOS PCT: 7 %
Monocytes Absolute: 826 cells/uL (ref 200–950)
NEUTROS ABS: 7670 {cells}/uL (ref 1500–7800)
Neutrophils Relative %: 65 %
PLATELETS: 226 10*3/uL (ref 140–400)
RBC: 5.98 MIL/uL — AB (ref 3.80–5.10)
RDW: 17.8 % — ABNORMAL HIGH (ref 11.0–15.0)
WBC: 11.8 10*3/uL — AB (ref 3.8–10.8)

## 2017-03-12 LAB — HEPATIC FUNCTION PANEL
ALK PHOS: 76 U/L (ref 33–130)
ALT: 14 U/L (ref 6–29)
AST: 15 U/L (ref 10–35)
Albumin: 4.2 g/dL (ref 3.6–5.1)
BILIRUBIN INDIRECT: 0.6 mg/dL (ref 0.2–1.2)
Bilirubin, Direct: 0.2 mg/dL (ref <=0.2)
TOTAL PROTEIN: 7.1 g/dL (ref 6.1–8.1)
Total Bilirubin: 0.8 mg/dL (ref 0.2–1.2)

## 2017-03-12 LAB — AFP TUMOR MARKER: AFP-Tumor Marker: 2.3 ng/mL (ref <6.1)

## 2017-03-23 NOTE — Progress Notes (Signed)
Patient is already on the recall for one year.

## 2017-04-05 IMAGING — US US ABDOMEN COMPLETE W/ ELASTOGRAPHY
1 series · 13 of 25 positions shown · non-contrast
Comparison: CT abdomen pelvis 09/12/2015

CLINICAL DATA: Patient with history of hepatitis-C and cirrhosis.



[Series 1: us abdomen complete w/ elastography · 0.16mm/px · 13 of 76 slices shown]
[im 1/76]
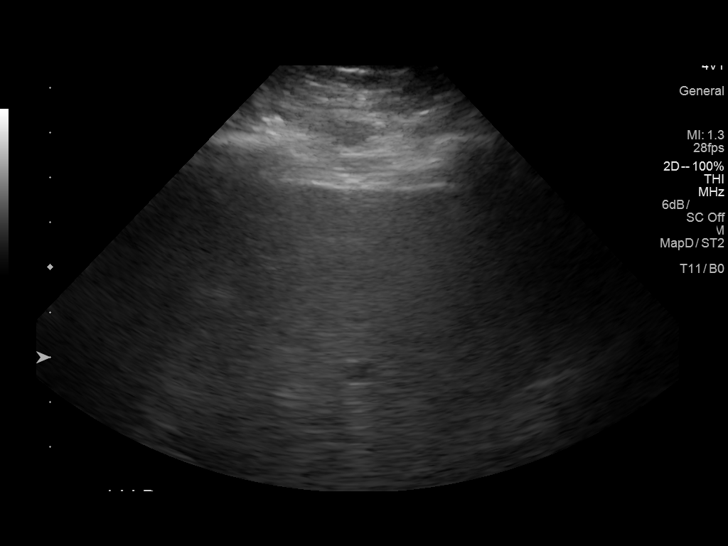
[im 7/76]
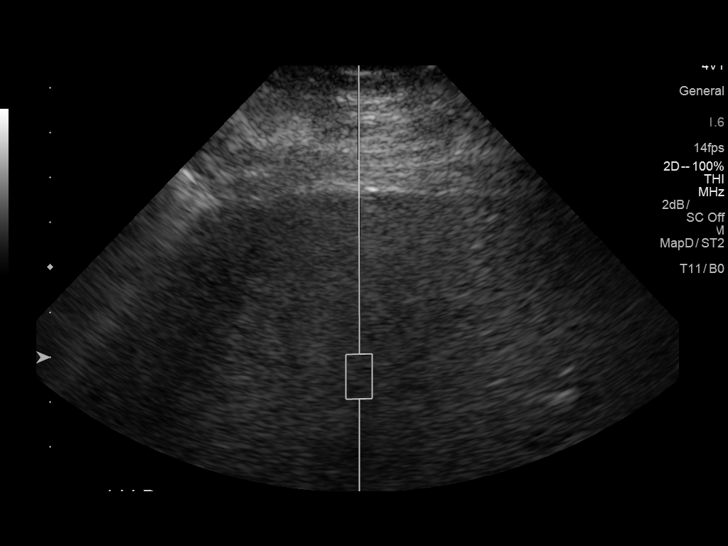
[im 13/76]
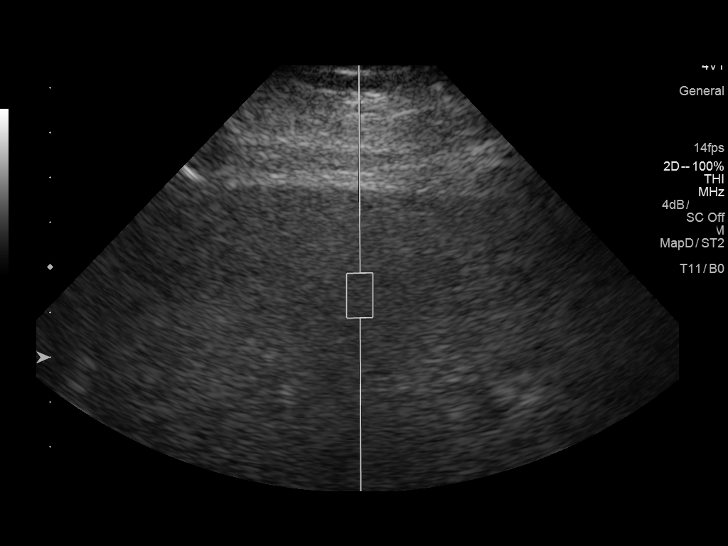
[im 19/76]
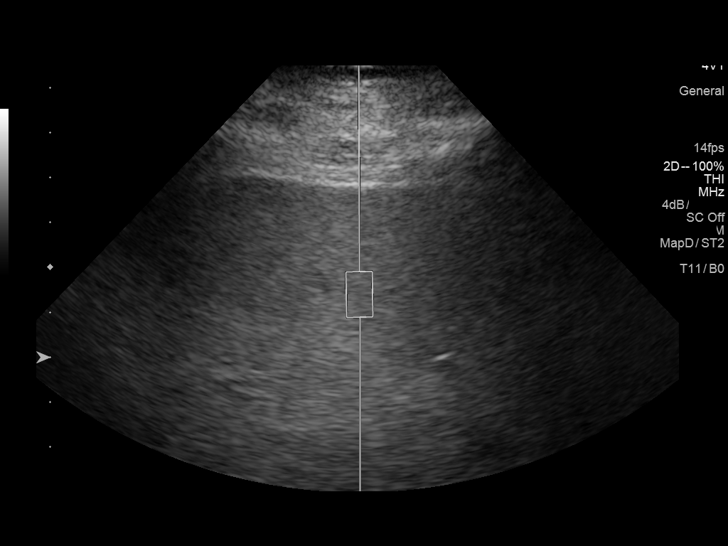
[im 26/76]
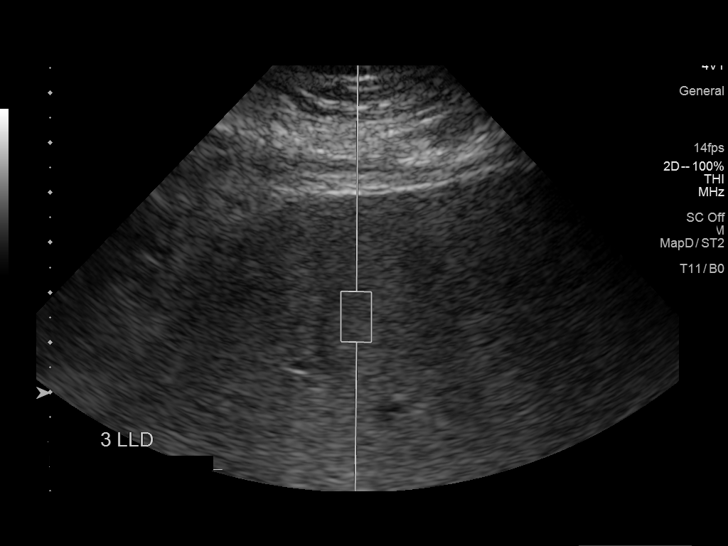
[im 32/76]
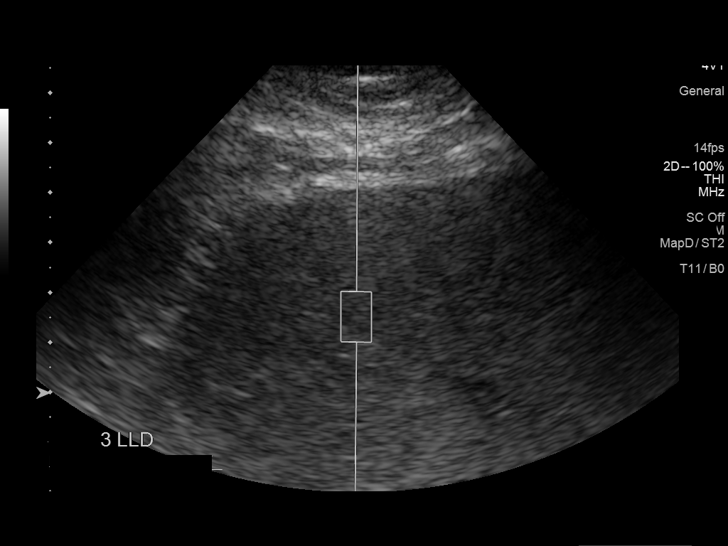
[im 38/76]
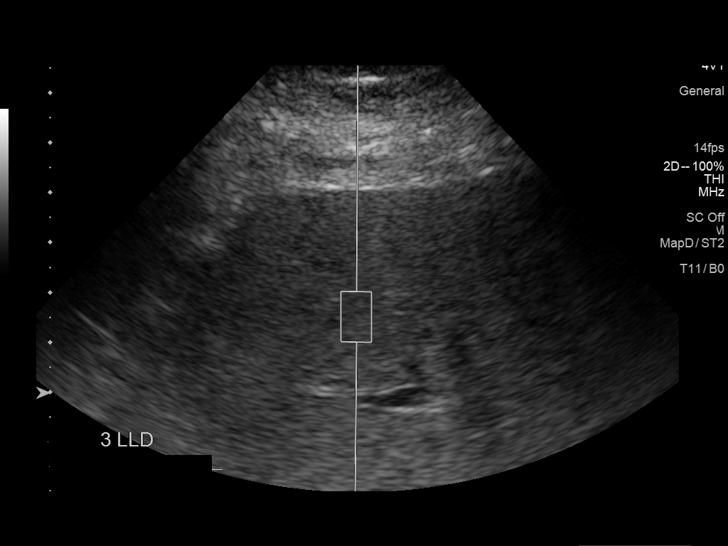
[im 44/76]
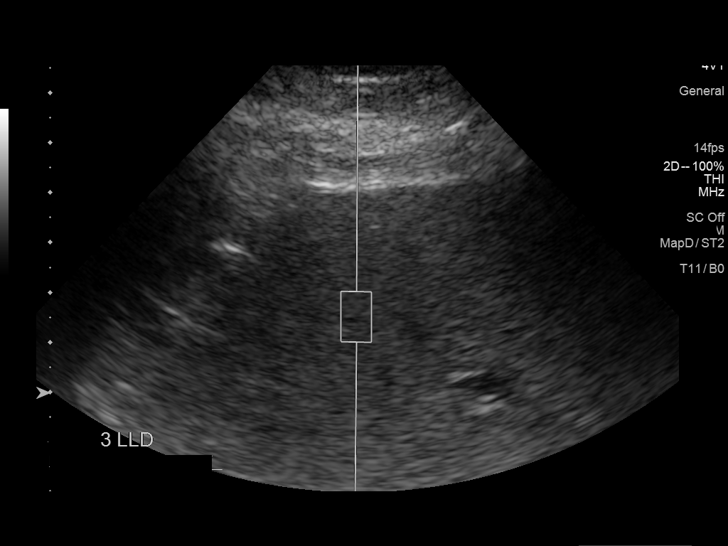
[im 51/76]
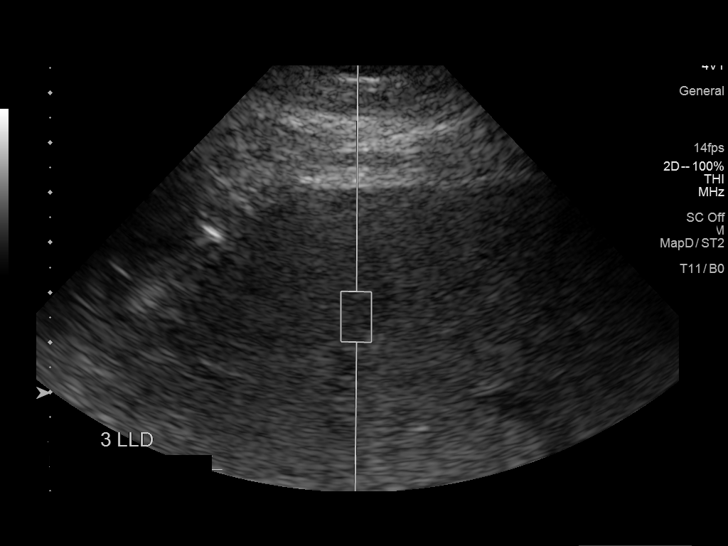
[im 57/76]
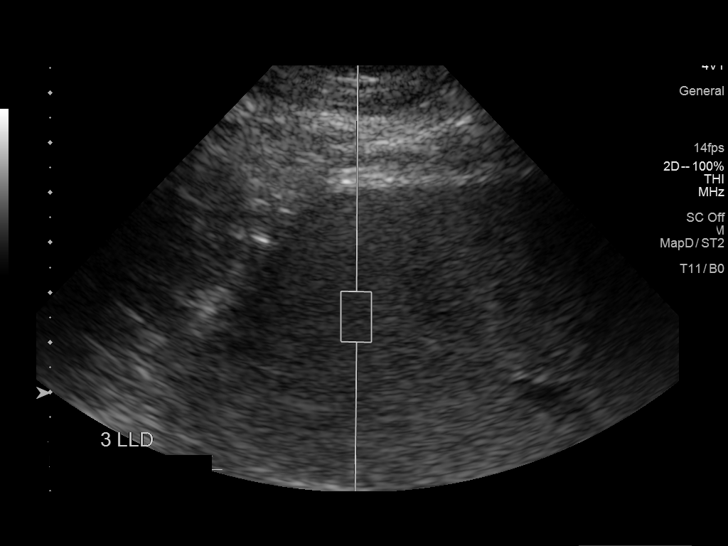
[im 63/76]
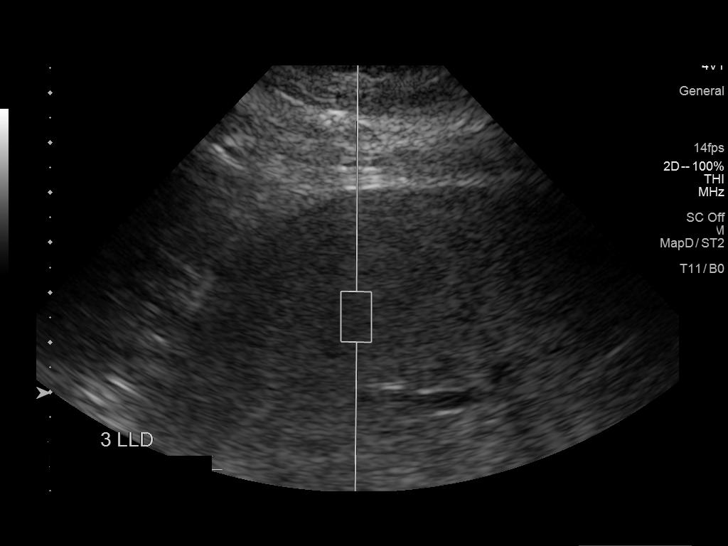
[im 69/76]
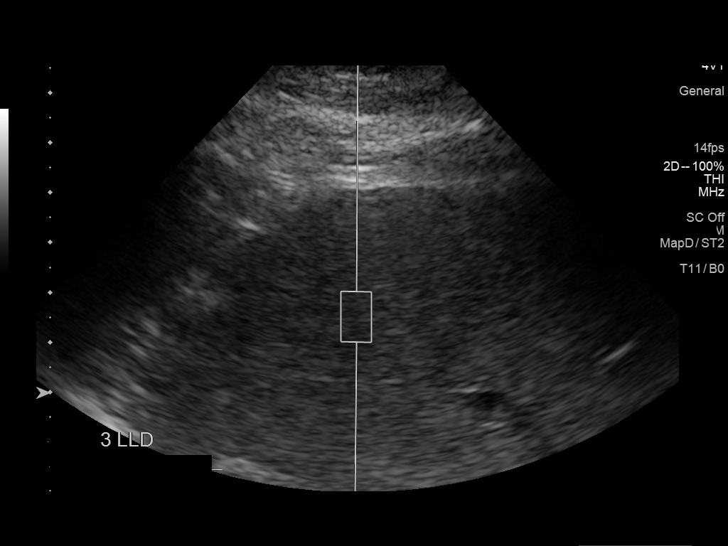
[im 76/76]
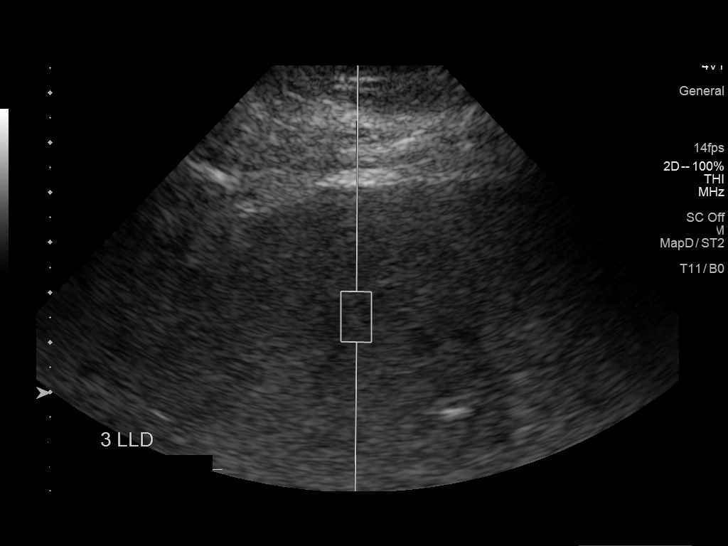

[13 of 25 positions shown; findings below may reference images not displayed]

FINDINGS: ULTRASOUND ABDOMEN

Gallbladder: Mild gallbladder wall thickening. No pericholecystic
fluid. Negative sonographic Murphy's sign. Multiple mobile echogenic
stones within the gallbladder lumen.

Common bile duct: Diameter: 3 mm

Liver: Diffusely increased in echogenicity. No focal lesion
identified.

IVC: No abnormality visualized.

Pancreas: Visualized portion unremarkable.

Spleen: Mildly enlarged measuring 13.1 cm.

Right Kidney: Length: 11.8 cm. Echogenicity within normal limits. No
mass or hydronephrosis visualized.

Left Kidney: Length: 11.2 cm. Echogenicity within normal limits. No
mass or hydronephrosis visualized.

Abdominal aorta: No aneurysm visualized.

Other findings: None.

ULTRASOUND HEPATIC ELASTOGRAPHY

Device: Siemens Helix VTQ

Patient position:  Oblique

Transducer 4V1

Number of measurements:  10

Hepatic Segment:  7

Median velocity:   3.92  m/sec

IQR:

IQR/Median velocity ratio

Corresponding Metavir fibrosis score:  Some F3 + F4

Risk of fibrosis: High

Limitations of exam: None

Pertinent findings noted on other imaging exams: Hepatic steatosis.
Cholelithiasis. Splenomegaly.

Please note that abnormal shear wave velocities may also be
identified in clinical settings other than with hepatic fibrosis,
such as: acute hepatitis, elevated right heart and central venous
pressures including use of beta blockers, Sandhir disease
(Yord[place]), infiltrative processes such as
mastocytosis/amyloidosis/infiltrative tumor, extrahepatic
cholestasis, in the post-prandial state, and liver transplantation.
Correlation with patient history, laboratory data, and clinical
condition recommended.
IMPRESSION: Hepatic steatosis.  Cholelithiasis.  Splenomegaly.

Median hepatic shear wave velocity is calculated at 3.92 m/sec.

Corresponding Metavir fibrosis score is Some F3 + F4.

Risk of fibrosis is high.

Follow-up:  Followup Advised

## 2017-04-05 IMAGING — US US ABDOMEN COMPLETE W/ ELASTOGRAPHY
1 series · 13 of 25 positions shown · non-contrast
Comparison: CT abdomen pelvis 09/12/2015

CLINICAL DATA: Patient with history of hepatitis-C and cirrhosis.



[Series 1: us abdomen complete w/ elastography · 0.25mm/px · 13 of 92 slices shown]
[im 1/92]
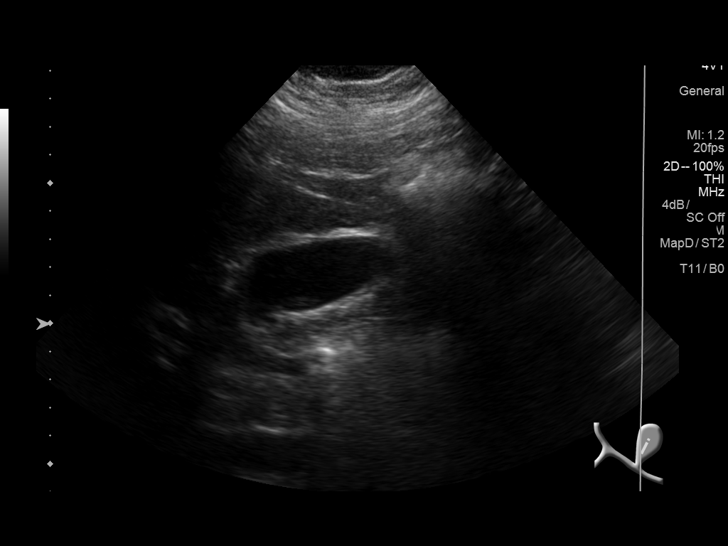
[im 8/92]
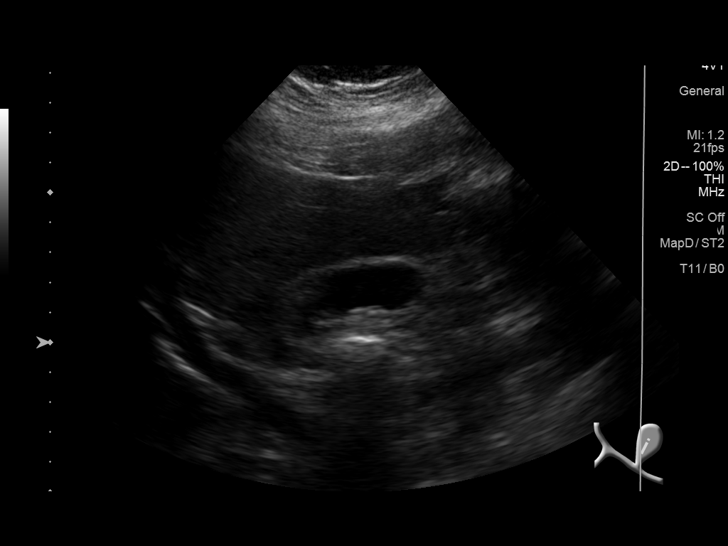
[im 16/92]
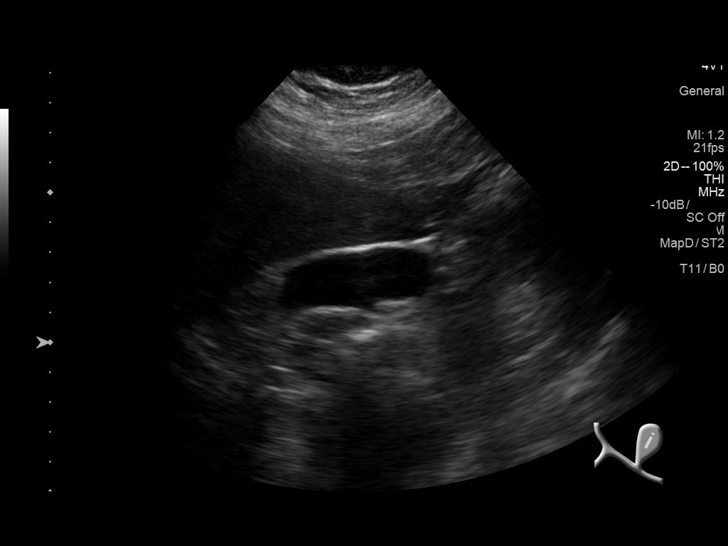
[im 23/92]
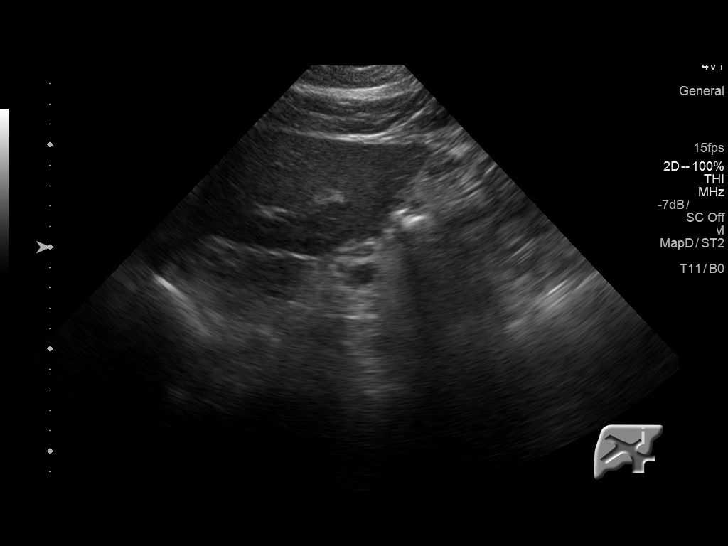
[im 31/92]
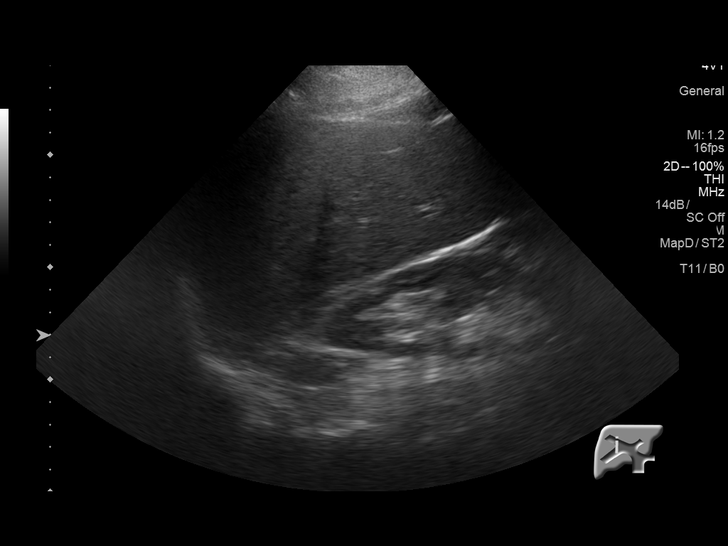
[im 38/92]
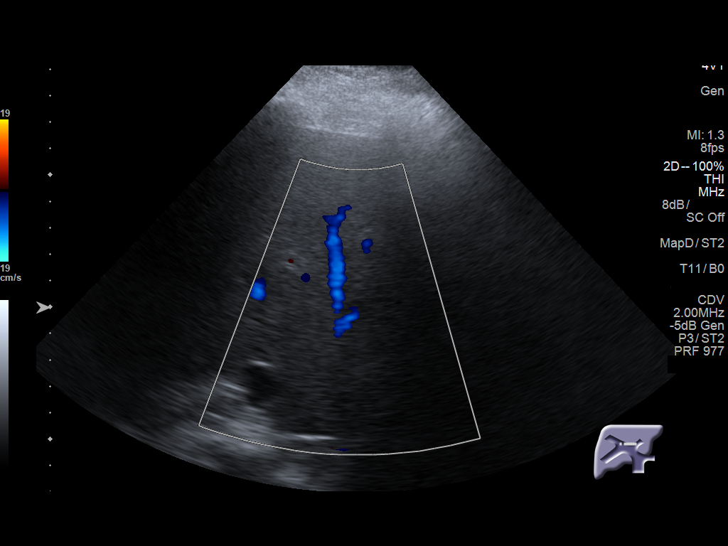
[im 46/92]
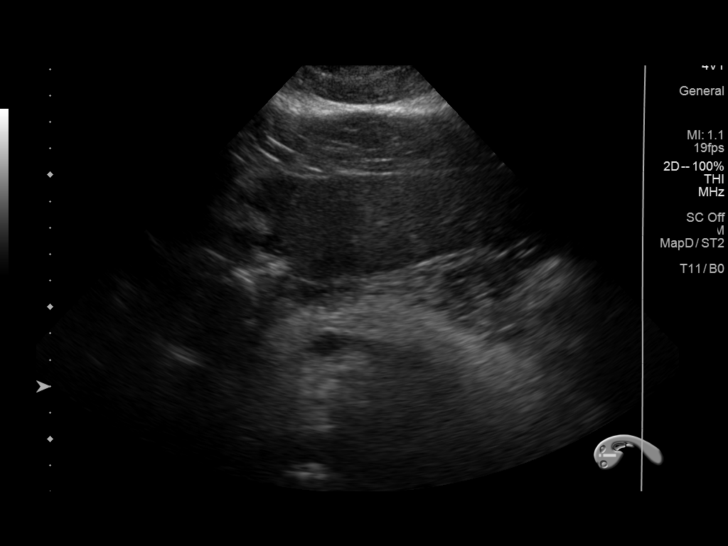
[im 54/92]
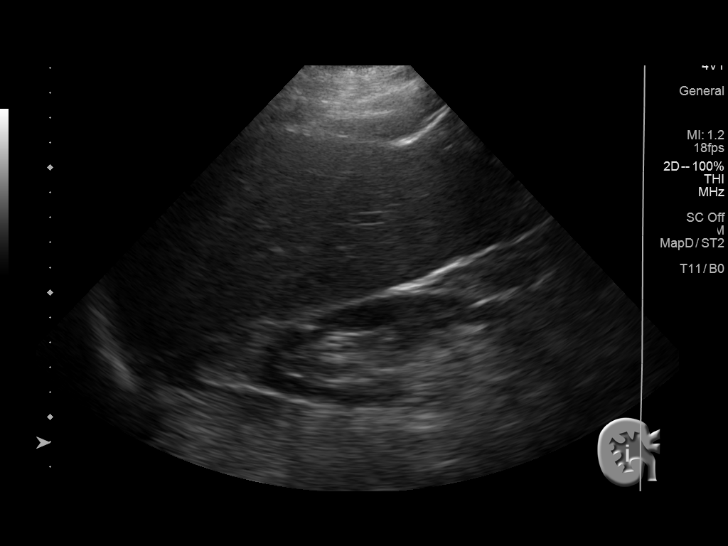
[im 61/92]
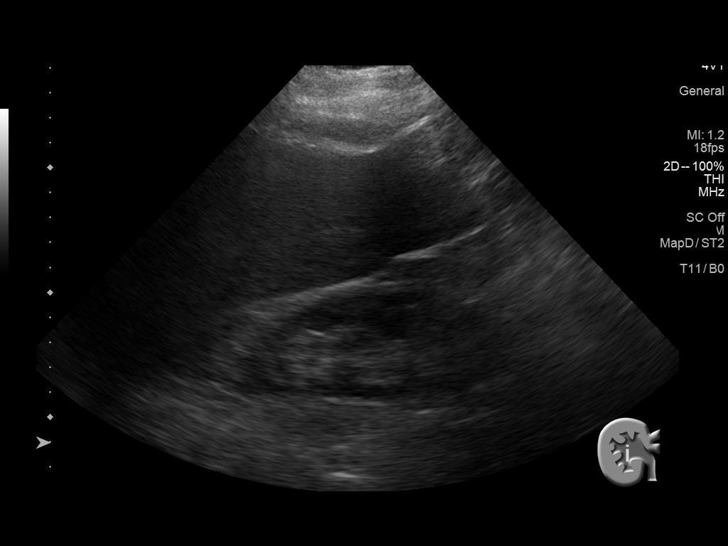
[im 69/92]
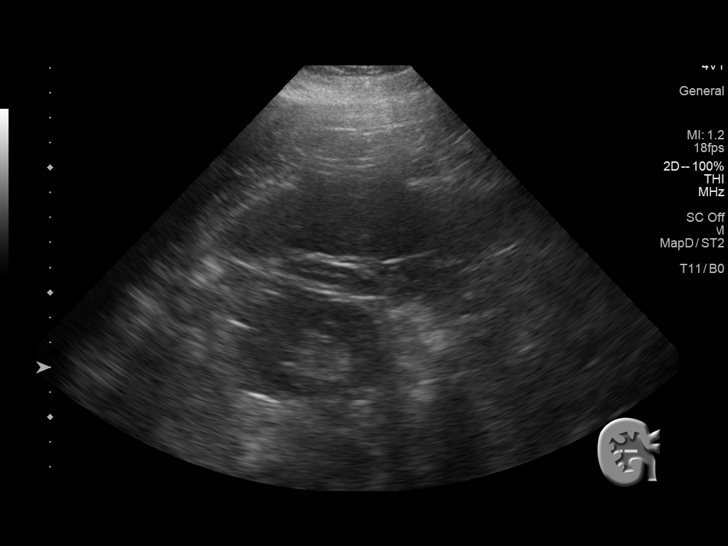
[im 76/92]
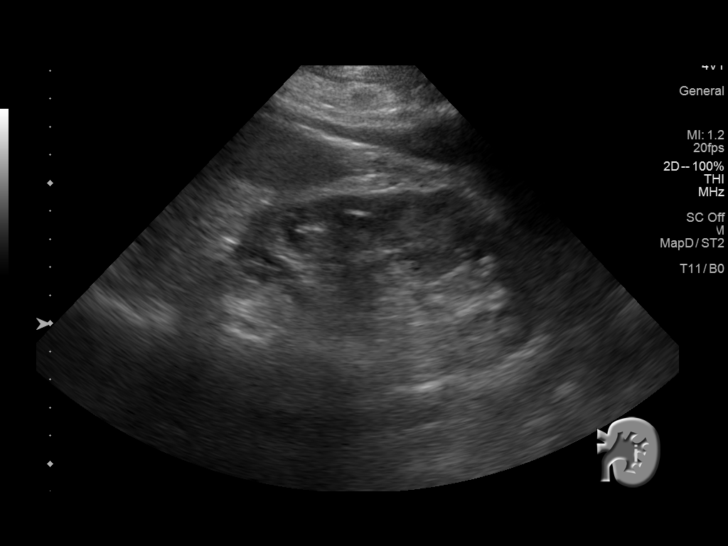
[im 84/92]
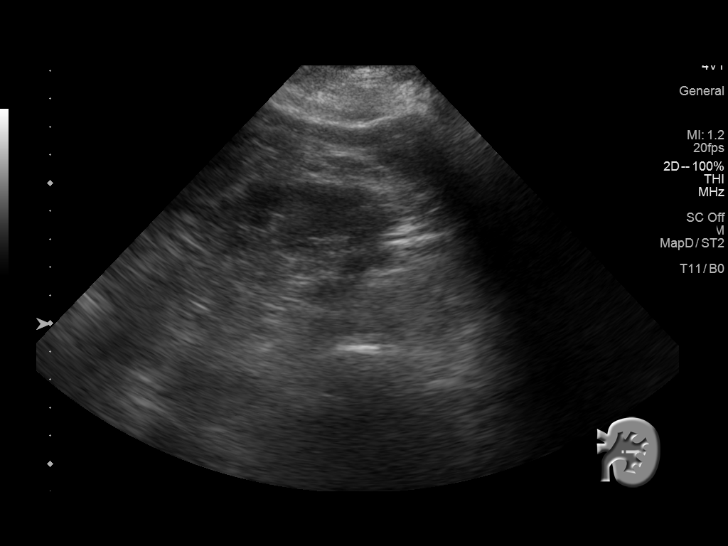
[im 92/92]
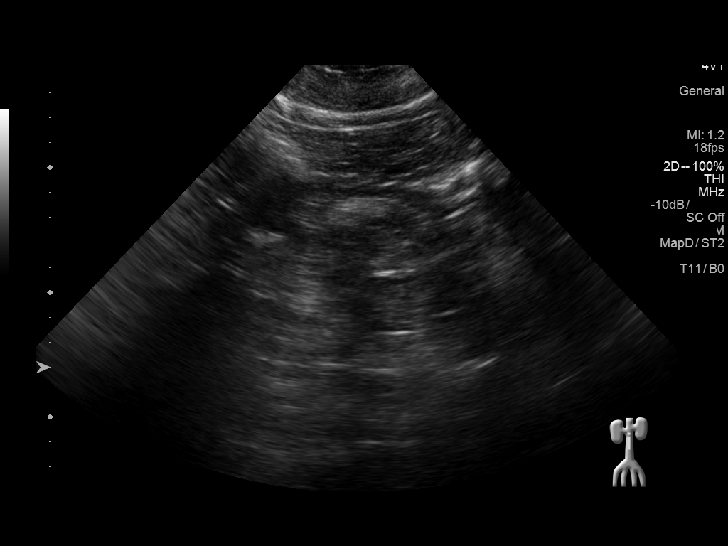

[13 of 25 positions shown; findings below may reference images not displayed]

FINDINGS: ULTRASOUND ABDOMEN

Gallbladder: Mild gallbladder wall thickening. No pericholecystic
fluid. Negative sonographic Murphy's sign. Multiple mobile echogenic
stones within the gallbladder lumen.

Common bile duct: Diameter: 3 mm

Liver: Diffusely increased in echogenicity. No focal lesion
identified.

IVC: No abnormality visualized.

Pancreas: Visualized portion unremarkable.

Spleen: Mildly enlarged measuring 13.1 cm.

Right Kidney: Length: 11.8 cm. Echogenicity within normal limits. No
mass or hydronephrosis visualized.

Left Kidney: Length: 11.2 cm. Echogenicity within normal limits. No
mass or hydronephrosis visualized.

Abdominal aorta: No aneurysm visualized.

Other findings: None.

ULTRASOUND HEPATIC ELASTOGRAPHY

Device: Siemens Helix VTQ

Patient position:  Oblique

Transducer 4V1

Number of measurements:  10

Hepatic Segment:  7

Median velocity:   3.92  m/sec

IQR:

IQR/Median velocity ratio

Corresponding Metavir fibrosis score:  Some F3 + F4

Risk of fibrosis: High

Limitations of exam: None

Pertinent findings noted on other imaging exams: Hepatic steatosis.
Cholelithiasis. Splenomegaly.

Please note that abnormal shear wave velocities may also be
identified in clinical settings other than with hepatic fibrosis,
such as: acute hepatitis, elevated right heart and central venous
pressures including use of beta blockers, Sandhir disease
(Yorddy), infiltrative processes such as
mastocytosis/amyloidosis/infiltrative tumor, extrahepatic
cholestasis, in the post-prandial state, and liver transplantation.
Correlation with patient history, laboratory data, and clinical
condition recommended.
IMPRESSION: Hepatic steatosis.  Cholelithiasis.  Splenomegaly.

Median hepatic shear wave velocity is calculated at 3.92 m/sec.

Corresponding Metavir fibrosis score is Some F3 + F4.

Risk of fibrosis is high.

Follow-up:  Followup Advised

## 2017-04-16 ENCOUNTER — Encounter (INDEPENDENT_AMBULATORY_CARE_PROVIDER_SITE_OTHER): Payer: Self-pay | Admitting: *Deleted

## 2017-04-21 ENCOUNTER — Other Ambulatory Visit (INDEPENDENT_AMBULATORY_CARE_PROVIDER_SITE_OTHER): Payer: Self-pay | Admitting: Internal Medicine

## 2017-04-21 DIAGNOSIS — B1920 Unspecified viral hepatitis C without hepatic coma: Secondary | ICD-10-CM

## 2017-04-28 ENCOUNTER — Ambulatory Visit (HOSPITAL_COMMUNITY): Payer: 59

## 2017-05-05 ENCOUNTER — Ambulatory Visit (HOSPITAL_COMMUNITY)
Admission: RE | Admit: 2017-05-05 | Discharge: 2017-05-05 | Disposition: A | Discharge status: Home / Self Care | Payer: 59 | Source: Ambulatory Visit | Attending: Internal Medicine | Admitting: Internal Medicine

## 2017-05-05 DIAGNOSIS — B1920 Unspecified viral hepatitis C without hepatic coma: Secondary | ICD-10-CM | POA: Diagnosis present

## 2017-05-05 DIAGNOSIS — K8020 Calculus of gallbladder without cholecystitis without obstruction: Secondary | ICD-10-CM | POA: Diagnosis not present

## 2017-10-06 ENCOUNTER — Encounter (INDEPENDENT_AMBULATORY_CARE_PROVIDER_SITE_OTHER): Payer: Self-pay | Admitting: *Deleted

## 2017-11-04 ENCOUNTER — Other Ambulatory Visit (INDEPENDENT_AMBULATORY_CARE_PROVIDER_SITE_OTHER): Payer: Self-pay | Admitting: Internal Medicine

## 2017-11-04 DIAGNOSIS — K7460 Unspecified cirrhosis of liver: Secondary | ICD-10-CM

## 2017-11-12 ENCOUNTER — Ambulatory Visit (HOSPITAL_COMMUNITY)
Admission: RE | Admit: 2017-11-12 | Discharge: 2017-11-12 | Disposition: A | Discharge status: Home / Self Care | Payer: 59 | Source: Ambulatory Visit | Attending: Internal Medicine | Admitting: Internal Medicine

## 2017-11-12 DIAGNOSIS — K8020 Calculus of gallbladder without cholecystitis without obstruction: Secondary | ICD-10-CM | POA: Diagnosis not present

## 2017-11-12 DIAGNOSIS — K7460 Unspecified cirrhosis of liver: Secondary | ICD-10-CM

## 2018-03-11 ENCOUNTER — Ambulatory Visit (INDEPENDENT_AMBULATORY_CARE_PROVIDER_SITE_OTHER): Payer: 59 | Admitting: Internal Medicine

## 2018-04-06 ENCOUNTER — Ambulatory Visit (INDEPENDENT_AMBULATORY_CARE_PROVIDER_SITE_OTHER): Payer: 59 | Admitting: Internal Medicine

## 2018-04-26 ENCOUNTER — Encounter (INDEPENDENT_AMBULATORY_CARE_PROVIDER_SITE_OTHER): Payer: Self-pay

## 2018-05-18 ENCOUNTER — Encounter (INDEPENDENT_AMBULATORY_CARE_PROVIDER_SITE_OTHER): Payer: Self-pay | Admitting: *Deleted

## 2018-07-20 ENCOUNTER — Other Ambulatory Visit (INDEPENDENT_AMBULATORY_CARE_PROVIDER_SITE_OTHER): Payer: Self-pay | Admitting: *Deleted

## 2018-07-20 DIAGNOSIS — Z8619 Personal history of other infectious and parasitic diseases: Secondary | ICD-10-CM

## 2018-07-28 ENCOUNTER — Ambulatory Visit (HOSPITAL_COMMUNITY)
Admission: RE | Admit: 2018-07-28 | Discharge: 2018-07-28 | Disposition: A | Discharge status: Home / Self Care | Payer: 59 | Source: Ambulatory Visit | Attending: Internal Medicine | Admitting: Internal Medicine

## 2018-07-28 DIAGNOSIS — B1920 Unspecified viral hepatitis C without hepatic coma: Secondary | ICD-10-CM | POA: Diagnosis present

## 2018-07-28 DIAGNOSIS — K8020 Calculus of gallbladder without cholecystitis without obstruction: Secondary | ICD-10-CM | POA: Diagnosis not present

## 2018-07-28 DIAGNOSIS — Z8619 Personal history of other infectious and parasitic diseases: Secondary | ICD-10-CM

## 2018-08-10 ENCOUNTER — Ambulatory Visit (INDEPENDENT_AMBULATORY_CARE_PROVIDER_SITE_OTHER): Payer: 59 | Admitting: Internal Medicine

## 2018-08-10 ENCOUNTER — Encounter (INDEPENDENT_AMBULATORY_CARE_PROVIDER_SITE_OTHER): Payer: Self-pay | Admitting: Internal Medicine

## 2018-08-10 VITALS — BP 130/80 | HR 74 | Temp 97.4°F | Resp 18 | Ht 65.0 in | Wt 193.2 lb

## 2018-08-10 DIAGNOSIS — K7469 Other cirrhosis of liver: Secondary | ICD-10-CM

## 2018-08-10 DIAGNOSIS — K8020 Calculus of gallbladder without cholecystitis without obstruction: Secondary | ICD-10-CM

## 2018-08-10 NOTE — Patient Instructions (Signed)
Alpha-fetoprotein to be done with routine blood work by primary care physician.

## 2018-08-10 NOTE — Progress Notes (Signed)
Presenting complaint;  Follow-up for chronic liver disease.  Database and subjective:  Patient is 67 year old Caucasian female who has a history of chronic hepatitis C which was finally eradicated after third attempt who has compensated cirrhosis or stage III disease who is here for scheduled visit.  She also has history of cholelithiasis and was evaluated at [facility] surgery and observation was recommended.  She is here for scheduled visit.  She was last seen in April 2018. She had ultrasound on 07/28/2018. She has no complaints.  She says she has good appetite.  Last year her husband was quite sick.  He eventually passed away.  She states that during his illness she did not do any walking or exercise and gained over 20 pounds.  Now she is gone back to  walking and hopes to bring her weight down.  She denies abdominal pain melena or rectal bleeding.  Her bowels move daily.  She has infrequent heartburn and uses medication on as-needed basis.  Current Medications: Outpatient Encounter Medications as of 08/10/2018  Medication Sig  . amLODipine (NORVASC) 5 MG tablet Take 10 mg by mouth daily.   . calcium citrate-vitamin D (CALCIUM + D) 315-200 MG-UNIT per tablet Take by mouth 2 (two) times daily.    Marland Kitchen LORazepam (ATIVAN) 1 MG tablet TAKE 1 TABLET BY MOUTH 2 TIMES DAILY AS NEEDED  . metoprolol (TOPROL-XL) 50 MG 24 hr tablet Take 100 mg by mouth daily.   . Omega-3 Fatty Acids (FISH OIL PO) Take by mouth daily.  Marland Kitchen omeprazole (PRILOSEC OTC) 20 MG tablet Take 20 mg by mouth as needed.   Facility-Administered Encounter Medications as of 08/10/2018  Medication  . 0.9 %  sodium chloride infusion     Objective: Blood pressure 130/80, pulse 74, temperature (!) 97.4 F (36.3 C), temperature source Oral, resp. rate 18, height 5\' 5"  (1.651 m), weight 193 lb 3.2 oz (87.6 kg). Patient is alert and in no acute distress. Conjunctiva is pink. Sclera is nonicteric Oropharyngeal mucosa is normal. No neck  masses or thyromegaly noted. Cardiac exam with regular rhythm normal S1 and S2. No murmur or gallop noted. Lungs are clear to auscultation. Abdomen is symmetrical soft and nontender with organomegaly or masses. No LE edema or clubbing noted.  Labs/studies Results:  Ultrasound on 07/28/2018 reveals Heterogeneous liver parenchyma consistent with fatty liver disease. Cholelithiasis with mild gallbladder wall thickening but no evidence of dilated bile ducts or choledocholithiasis.  Assessment:  #1.  Hepatic fibrosis/cirrhosis secondary to hepatitis C successfully treated with Harvoni in 2015(2 prior attempts unsuccessful). Stage III/IV disease based on elastography March 2017.  She also had liver biopsy in 2008 which revealed stage III disease.  Recent ultrasound negative for focal hepatic abnormalities.  #2.  Asymptomatic cholelithiasis.  She has been seen by a surgeon in the past.  She is not having any symptoms to suggest symptomatic cholelithiasis.  Therefore we will continue to monitor.  Plan:  Patient advised to make an appointment with Dr. Dimas Aguas for annual physical exam and she can get her routine blood work. She will have AFP with her next blood work. Ultrasound in 6 months. Office visit in 1 year.

## 2018-08-23 ENCOUNTER — Other Ambulatory Visit (INDEPENDENT_AMBULATORY_CARE_PROVIDER_SITE_OTHER): Payer: Self-pay | Admitting: Internal Medicine

## 2018-08-24 LAB — AFP TUMOR MARKER: AFP, Serum, Tumor Marker: 2.7 ng/mL (ref 0.0–8.3)

## 2018-08-30 ENCOUNTER — Other Ambulatory Visit (INDEPENDENT_AMBULATORY_CARE_PROVIDER_SITE_OTHER): Payer: Self-pay | Admitting: *Deleted

## 2018-08-30 DIAGNOSIS — K739 Chronic hepatitis, unspecified: Secondary | ICD-10-CM

## 2018-12-31 ENCOUNTER — Encounter (INDEPENDENT_AMBULATORY_CARE_PROVIDER_SITE_OTHER): Payer: Self-pay | Admitting: *Deleted

## 2019-02-02 ENCOUNTER — Encounter (INDEPENDENT_AMBULATORY_CARE_PROVIDER_SITE_OTHER): Payer: Self-pay | Admitting: *Deleted

## 2019-02-02 ENCOUNTER — Other Ambulatory Visit (INDEPENDENT_AMBULATORY_CARE_PROVIDER_SITE_OTHER): Payer: Self-pay | Admitting: *Deleted

## 2019-02-02 DIAGNOSIS — K739 Chronic hepatitis, unspecified: Secondary | ICD-10-CM

## 2019-02-14 ENCOUNTER — Other Ambulatory Visit (INDEPENDENT_AMBULATORY_CARE_PROVIDER_SITE_OTHER): Payer: Self-pay | Admitting: *Deleted

## 2019-02-14 DIAGNOSIS — Z8619 Personal history of other infectious and parasitic diseases: Secondary | ICD-10-CM

## 2019-02-18 ENCOUNTER — Other Ambulatory Visit: Payer: Self-pay

## 2019-02-18 ENCOUNTER — Ambulatory Visit (HOSPITAL_COMMUNITY)
Admission: RE | Admit: 2019-02-18 | Discharge: 2019-02-18 | Disposition: A | Discharge status: Home / Self Care | Payer: Medicare Other | Source: Ambulatory Visit | Attending: Internal Medicine | Admitting: Internal Medicine

## 2019-02-18 DIAGNOSIS — Z8619 Personal history of other infectious and parasitic diseases: Secondary | ICD-10-CM | POA: Diagnosis present

## 2019-07-15 DIAGNOSIS — Z1231 Encounter for screening mammogram for malignant neoplasm of breast: Secondary | ICD-10-CM | POA: Diagnosis not present

## 2019-08-16 ENCOUNTER — Other Ambulatory Visit: Payer: Self-pay

## 2019-08-16 ENCOUNTER — Ambulatory Visit (INDEPENDENT_AMBULATORY_CARE_PROVIDER_SITE_OTHER): Payer: Medicare Other | Admitting: Internal Medicine

## 2019-08-16 ENCOUNTER — Encounter (INDEPENDENT_AMBULATORY_CARE_PROVIDER_SITE_OTHER): Payer: Self-pay | Admitting: Internal Medicine

## 2019-08-16 VITALS — BP 165/77 | HR 63 | Temp 98.2°F | Ht 65.0 in | Wt 190.7 lb

## 2019-08-16 DIAGNOSIS — K7460 Unspecified cirrhosis of liver: Secondary | ICD-10-CM | POA: Insufficient documentation

## 2019-08-16 DIAGNOSIS — K7400 Hepatic fibrosis, unspecified: Secondary | ICD-10-CM | POA: Insufficient documentation

## 2019-08-16 DIAGNOSIS — K8020 Calculus of gallbladder without cholecystitis without obstruction: Secondary | ICD-10-CM

## 2019-08-16 NOTE — Progress Notes (Signed)
Presenting complaint;  Follow-up for hepatic fibrosis and cholelithiasis.  Database and subjective:  Patient is 68 year old Caucasian female who has history of chronic hepatitis C genotype 1b for which she was successfully treated in 2015 with 12 weeks Harvoni.  She previously had been treated twice.  [facility] was [facility] and was discontinued because of her profound anemia.  She was treated by me in 2012.  With 12 weeks of telaprevir along with 30 weeks of Pegasys and ribavirin and required blood transfusion but failed to receive SVR.  She has thalassemia minor complicating her management. Liver biopsy back in April 2008 revealed stage III disease. However based on elastography she has F3 and F4 disease. She also has cholelithiasis.  She may have had symptoms once.  She was seen by her surgeon but was decided to observe. She was last seen in September 2019.  She says she is doing well.  She walks regularly.  She walks at least 3 times a week and each time she does 2 miles.  She has good appetite.  She is trying to lose weight.  She has lost 3 pounds since her last visit.  Few years ago she weighed 205 pounds.  She denies abdominal pain pruritus diarrhea constipation melena or rectal bleeding.  She has occasional nausea and heartburn after meals but is not associated with abdominal or chest pain.  She says the symptoms resolved spontaneously within few minutes.  She says she does not eat much red meat.   Current Medications: Outpatient Encounter Medications as of 08/16/2019  Medication Sig  . amLODipine (NORVASC) 5 MG tablet Take 10 mg by mouth daily.   . calcium citrate-vitamin D (CALCIUM + D) 315-200 MG-UNIT per tablet Take by mouth 2 (two) times daily.    . metoprolol (TOPROL-XL) 50 MG 24 hr tablet Take 100 mg by mouth daily.   . Omega-3 Fatty Acids (FISH OIL PO) Take 1 g by mouth daily.   Marland Kitchen. omeprazole (PRILOSEC OTC) 20 MG tablet Take 20 mg by mouth as needed.  .  [DISCONTINUED] LORazepam (ATIVAN) 1 MG tablet TAKE 1 TABLET BY MOUTH 2 TIMES DAILY AS NEEDED   Facility-Administered Encounter Medications as of 08/16/2019  Medication  . 0.9 %  sodium chloride infusion     Objective: Blood pressure (!) 165/77, pulse 63, temperature 98.2 F (36.8 C), temperature source Oral, height 5\' 5"  (1.651 m), weight 190 lb 11.2 oz (86.5 kg). Patient is alert and in no acute distress. Conjunctiva is pink. Sclera is nonicteric Oropharyngeal mucosa is normal. No neck masses or thyromegaly noted. Cardiac exam with regular rhythm normal S1 and S2. No murmur or gallop noted. Lungs are clear to auscultation. Abdomen is full but soft and nontender with organomegaly or masses. No LE edema or clubbing noted.  Labs/studies Results: Lab data from 05/23/2019 provided by the patient. Bilirubin 0.8, AP 68, AST 13, ALT 14, total protein 6.8 and albumin of 4.2. Serum calcium 9.2. Glucose 104. BUN 12 and creatinine 0.92. Serum sodium 139, potassium 4.4, chloride 103, CO2 21.  Ultrasound from 02/18/2019 was negative for focal hepatic abnormalities.  It revealed echogenic texture and cholelithiasis.   AFP 2.7 on 08/22/2019.  Assessment:  #1.  Hepatic fibrosis secondary to successfully treated chronic hepatitis C.  Liver biopsy in April 2008 revealed stage III disease and elastography in 2017 reveals stage F3 and F4 disease.  She does not have splenomegaly or other stigmata of portal hypertension.  She therefore has well-preserved  hepatic function. I do not feel there is any indication or need for EGD.  However she should continue [place] surveillance.  She may also have fatty liver.  #2.  Cholelithiasis presumed to be asymptomatic.  We will continue to  monitor symptoms closely.   Plan:  Abdominal ultrasound for [place] screening. Alpha-fetoprotein. Patient will call office should she experience abdominal chest or shoulder pain. Office visit in 1 year.

## 2019-08-16 NOTE — Patient Instructions (Signed)
Physician will call with results of blood test and ultrasound when completed. 

## 2019-08-22 ENCOUNTER — Ambulatory Visit (HOSPITAL_COMMUNITY)
Admission: RE | Admit: 2019-08-22 | Discharge: 2019-08-22 | Disposition: A | Discharge status: Home / Self Care | Payer: Medicare Other | Source: Ambulatory Visit | Attending: Internal Medicine | Admitting: Internal Medicine

## 2019-08-22 ENCOUNTER — Other Ambulatory Visit: Payer: Self-pay

## 2019-08-22 DIAGNOSIS — K740 Hepatic fibrosis: Secondary | ICD-10-CM | POA: Insufficient documentation

## 2019-08-22 DIAGNOSIS — K8020 Calculus of gallbladder without cholecystitis without obstruction: Secondary | ICD-10-CM | POA: Diagnosis not present

## 2019-11-22 DIAGNOSIS — E039 Hypothyroidism, unspecified: Secondary | ICD-10-CM | POA: Diagnosis not present

## 2019-11-22 DIAGNOSIS — I10 Essential (primary) hypertension: Secondary | ICD-10-CM | POA: Diagnosis not present

## 2019-11-22 DIAGNOSIS — E782 Mixed hyperlipidemia: Secondary | ICD-10-CM | POA: Diagnosis not present

## 2019-11-23 DIAGNOSIS — E039 Hypothyroidism, unspecified: Secondary | ICD-10-CM | POA: Diagnosis not present

## 2019-11-23 DIAGNOSIS — R55 Syncope and collapse: Secondary | ICD-10-CM | POA: Diagnosis not present

## 2019-11-23 DIAGNOSIS — I10 Essential (primary) hypertension: Secondary | ICD-10-CM | POA: Diagnosis not present

## 2019-11-25 DIAGNOSIS — I10 Essential (primary) hypertension: Secondary | ICD-10-CM | POA: Diagnosis not present

## 2019-11-25 DIAGNOSIS — R55 Syncope and collapse: Secondary | ICD-10-CM | POA: Diagnosis not present

## 2019-11-25 DIAGNOSIS — I6523 Occlusion and stenosis of bilateral carotid arteries: Secondary | ICD-10-CM | POA: Diagnosis not present

## 2019-11-28 DIAGNOSIS — R55 Syncope and collapse: Secondary | ICD-10-CM | POA: Diagnosis not present

## 2019-11-28 DIAGNOSIS — K7469 Other cirrhosis of liver: Secondary | ICD-10-CM | POA: Diagnosis not present

## 2019-11-28 DIAGNOSIS — Z23 Encounter for immunization: Secondary | ICD-10-CM | POA: Diagnosis not present

## 2019-11-28 DIAGNOSIS — E039 Hypothyroidism, unspecified: Secondary | ICD-10-CM | POA: Diagnosis not present

## 2019-11-28 DIAGNOSIS — I10 Essential (primary) hypertension: Secondary | ICD-10-CM | POA: Diagnosis not present

## 2019-12-20 DIAGNOSIS — D519 Vitamin B12 deficiency anemia, unspecified: Secondary | ICD-10-CM | POA: Diagnosis not present

## 2019-12-20 DIAGNOSIS — D649 Anemia, unspecified: Secondary | ICD-10-CM | POA: Diagnosis not present

## 2019-12-20 DIAGNOSIS — D529 Folate deficiency anemia, unspecified: Secondary | ICD-10-CM | POA: Diagnosis not present

## 2020-02-02 ENCOUNTER — Encounter (INDEPENDENT_AMBULATORY_CARE_PROVIDER_SITE_OTHER): Payer: Self-pay | Admitting: *Deleted

## 2020-02-29 ENCOUNTER — Other Ambulatory Visit (INDEPENDENT_AMBULATORY_CARE_PROVIDER_SITE_OTHER): Payer: Self-pay | Admitting: *Deleted

## 2020-02-29 DIAGNOSIS — Z8619 Personal history of other infectious and parasitic diseases: Secondary | ICD-10-CM

## 2020-03-06 ENCOUNTER — Ambulatory Visit (HOSPITAL_COMMUNITY)
Admission: RE | Admit: 2020-03-06 | Discharge: 2020-03-06 | Disposition: A | Discharge status: Home / Self Care | Payer: Medicare Other | Source: Ambulatory Visit | Attending: Internal Medicine | Admitting: Internal Medicine

## 2020-03-06 ENCOUNTER — Other Ambulatory Visit: Payer: Self-pay

## 2020-03-06 DIAGNOSIS — Z8619 Personal history of other infectious and parasitic diseases: Secondary | ICD-10-CM | POA: Insufficient documentation

## 2020-03-06 DIAGNOSIS — K8020 Calculus of gallbladder without cholecystitis without obstruction: Secondary | ICD-10-CM | POA: Diagnosis not present

## 2020-05-28 DIAGNOSIS — E039 Hypothyroidism, unspecified: Secondary | ICD-10-CM | POA: Diagnosis not present

## 2020-05-28 DIAGNOSIS — I10 Essential (primary) hypertension: Secondary | ICD-10-CM | POA: Diagnosis not present

## 2020-05-28 DIAGNOSIS — D529 Folate deficiency anemia, unspecified: Secondary | ICD-10-CM | POA: Diagnosis not present

## 2020-05-28 DIAGNOSIS — E782 Mixed hyperlipidemia: Secondary | ICD-10-CM | POA: Diagnosis not present

## 2020-06-04 DIAGNOSIS — E039 Hypothyroidism, unspecified: Secondary | ICD-10-CM | POA: Diagnosis not present

## 2020-06-04 DIAGNOSIS — I10 Essential (primary) hypertension: Secondary | ICD-10-CM | POA: Diagnosis not present

## 2020-06-04 DIAGNOSIS — E782 Mixed hyperlipidemia: Secondary | ICD-10-CM | POA: Diagnosis not present

## 2020-08-14 ENCOUNTER — Ambulatory Visit (INDEPENDENT_AMBULATORY_CARE_PROVIDER_SITE_OTHER): Payer: Medicare Other | Admitting: Internal Medicine

## 2020-08-14 ENCOUNTER — Other Ambulatory Visit: Payer: Self-pay

## 2020-08-14 ENCOUNTER — Encounter (INDEPENDENT_AMBULATORY_CARE_PROVIDER_SITE_OTHER): Payer: Self-pay | Admitting: *Deleted

## 2020-08-14 ENCOUNTER — Encounter (INDEPENDENT_AMBULATORY_CARE_PROVIDER_SITE_OTHER): Payer: Self-pay | Admitting: Internal Medicine

## 2020-08-14 VITALS — BP 145/65 | HR 75 | Temp 99.0°F | Ht 65.0 in | Wt 187.9 lb

## 2020-08-14 DIAGNOSIS — K7400 Hepatic fibrosis, unspecified: Secondary | ICD-10-CM | POA: Diagnosis not present

## 2020-08-14 DIAGNOSIS — K8020 Calculus of gallbladder without cholecystitis without obstruction: Secondary | ICD-10-CM

## 2020-08-14 NOTE — Progress Notes (Signed)
Presenting complaint;  Follow-up for hepatic fibrosis stage III secondary to hepatitis C which has been successfully treated with SVR.  Database and subjective:  Patient is 69 year old Caucasian female who has a history of chronic hepatitis C which was eventually treated successfully on third attempt.  She has history of thalassemia minor.  Her first treatment course was via [facility] and she developed severe anemia.  Second go around was for 48 weeks with 3 drugs and she required 8 units of PRBCs over a period of 48 weeks.  Virus was undetectable at end of treatment but she did not have SVR. Finally she responded to 12 weeks of Harvoni. Patient also history of cholelithiasis felt to be asymptomatic. She was last seen 1 year ago.  She has no complaints.  She walks at least 3 times a week for minimum 6 miles total.  She remains with good appetite but she has cut out eating fried foods and sweets.  She has lost 3 pounds since her last visit.  Few years ago she was over 200 pounds.  She denies heartburn dysphagia nausea vomiting abdominal pain fatigue melena or rectal bleeding.  Her bowels generally move daily. She is trying to quit.  She is down to 5 cigarettes/day.  Current Medications: Outpatient Encounter Medications as of 08/14/2020  Medication Sig  . amLODipine (NORVASC) 5 MG tablet Take 10 mg by mouth daily.   . calcium citrate-vitamin D (CALCIUM + D) 315-200 MG-UNIT per tablet Take by mouth 2 (two) times daily.    . metoprolol (TOPROL-XL) 50 MG 24 hr tablet Take 100 mg by mouth daily.   . Omega-3 Fatty Acids (FISH OIL PO) Take 1 g by mouth daily.  (Patient not taking: Reported on 08/14/2020)  . omeprazole (PRILOSEC OTC) 20 MG tablet Take 20 mg by mouth as needed. (Patient not taking: Reported on 08/14/2020)   Facility-Administered Encounter Medications as of 08/14/2020  Medication  . 0.9 %  sodium chloride infusion     Objective: Blood pressure (!) 145/65, pulse 75, temperature 99 F  (37.2 C), temperature source Oral, height 5\' 5"  (1.651 m), weight 187 lb 14.4 oz (85.2 kg). Patient is alert and in no acute distress. Patient is wearing a mask. Conjunctiva is pink. Sclera is nonicteric Oropharyngeal mucosa is normal. No neck masses or thyromegaly noted. Cardiac exam with regular rhythm normal S1 and S2. No murmur or gallop noted. Lungs are clear to auscultation. Abdomen is full but soft and nontender with organomegaly or masses. No LE edema or clubbing noted.  Labs/studies Results: AST and ALT were 13 and 14 respectively on 05/23/2019.  Assessment:  #1.  Hepatic fibrosis/stage III disease secondary to hepatitis C which has been successfully treated.  She is due for [facility] screening. Suspect she also has underlying fatty liver and glad to see that she is trying to lose weight and changing eating habits.  #2.  Asymptomatic cholelithiasis.  Patient will call should he experience upper abdominal pain.  While it was a score  #3.  Patient is average risk for colorectal carcinoma.  Colonoscopy in October 2007 was normal.  She should consider either screening colonoscopy or Cologuard.  She will think about it.  Plan:  Abdominal ultrasound. Office visit in 6 months.

## 2020-08-14 NOTE — Patient Instructions (Signed)
Physician will call with results of ultrasound when completed. Consider having screening colonoscopy or Cologuard test next year or when you are ready.

## 2020-08-20 ENCOUNTER — Other Ambulatory Visit: Payer: Self-pay

## 2020-08-20 ENCOUNTER — Ambulatory Visit (HOSPITAL_COMMUNITY)
Admission: RE | Admit: 2020-08-20 | Discharge: 2020-08-20 | Disposition: A | Discharge status: Home / Self Care | Payer: Medicare Other | Source: Ambulatory Visit | Attending: Internal Medicine | Admitting: Internal Medicine

## 2020-08-20 DIAGNOSIS — K7400 Hepatic fibrosis, unspecified: Secondary | ICD-10-CM | POA: Diagnosis not present

## 2020-08-20 DIAGNOSIS — K8020 Calculus of gallbladder without cholecystitis without obstruction: Secondary | ICD-10-CM | POA: Diagnosis not present

## 2020-08-21 ENCOUNTER — Other Ambulatory Visit (INDEPENDENT_AMBULATORY_CARE_PROVIDER_SITE_OTHER): Payer: Self-pay | Admitting: *Deleted

## 2020-08-21 DIAGNOSIS — K739 Chronic hepatitis, unspecified: Secondary | ICD-10-CM

## 2020-08-21 DIAGNOSIS — K8020 Calculus of gallbladder without cholecystitis without obstruction: Secondary | ICD-10-CM

## 2020-08-21 DIAGNOSIS — K7400 Hepatic fibrosis, unspecified: Secondary | ICD-10-CM

## 2020-12-11 DIAGNOSIS — I10 Essential (primary) hypertension: Secondary | ICD-10-CM | POA: Diagnosis not present

## 2020-12-11 DIAGNOSIS — E782 Mixed hyperlipidemia: Secondary | ICD-10-CM | POA: Diagnosis not present

## 2020-12-11 DIAGNOSIS — E038 Other specified hypothyroidism: Secondary | ICD-10-CM | POA: Diagnosis not present

## 2020-12-14 DIAGNOSIS — E782 Mixed hyperlipidemia: Secondary | ICD-10-CM | POA: Diagnosis not present

## 2020-12-14 DIAGNOSIS — E039 Hypothyroidism, unspecified: Secondary | ICD-10-CM | POA: Diagnosis not present

## 2020-12-14 DIAGNOSIS — I10 Essential (primary) hypertension: Secondary | ICD-10-CM | POA: Diagnosis not present

## 2021-01-24 ENCOUNTER — Other Ambulatory Visit (INDEPENDENT_AMBULATORY_CARE_PROVIDER_SITE_OTHER): Payer: Self-pay | Admitting: *Deleted

## 2021-01-24 DIAGNOSIS — K8020 Calculus of gallbladder without cholecystitis without obstruction: Secondary | ICD-10-CM

## 2021-01-24 DIAGNOSIS — K739 Chronic hepatitis, unspecified: Secondary | ICD-10-CM

## 2021-01-24 DIAGNOSIS — K7400 Hepatic fibrosis, unspecified: Secondary | ICD-10-CM

## 2021-01-31 ENCOUNTER — Encounter (INDEPENDENT_AMBULATORY_CARE_PROVIDER_SITE_OTHER): Payer: Self-pay | Admitting: *Deleted

## 2021-02-18 ENCOUNTER — Other Ambulatory Visit (INDEPENDENT_AMBULATORY_CARE_PROVIDER_SITE_OTHER): Payer: Self-pay

## 2021-02-18 ENCOUNTER — Telehealth (INDEPENDENT_AMBULATORY_CARE_PROVIDER_SITE_OTHER): Payer: Self-pay | Admitting: *Deleted

## 2021-02-18 DIAGNOSIS — B182 Chronic viral hepatitis C: Secondary | ICD-10-CM

## 2021-02-18 NOTE — Telephone Encounter (Signed)
Noted thank you

## 2021-02-18 NOTE — Telephone Encounter (Signed)
Patient l/m to sch'd 6 mth [place], wants as early as they can schedule, please call [phone]

## 2021-02-27 ENCOUNTER — Ambulatory Visit (HOSPITAL_COMMUNITY)
Admission: RE | Admit: 2021-02-27 | Discharge: 2021-02-27 | Disposition: A | Discharge status: Home / Self Care | Payer: Medicare Other | Source: Ambulatory Visit | Attending: Internal Medicine | Admitting: Internal Medicine

## 2021-02-27 DIAGNOSIS — B182 Chronic viral hepatitis C: Secondary | ICD-10-CM | POA: Diagnosis present

## 2021-02-27 DIAGNOSIS — K7400 Hepatic fibrosis, unspecified: Secondary | ICD-10-CM | POA: Diagnosis not present

## 2021-02-27 DIAGNOSIS — K7460 Unspecified cirrhosis of liver: Secondary | ICD-10-CM | POA: Diagnosis not present

## 2021-02-27 DIAGNOSIS — R161 Splenomegaly, not elsewhere classified: Secondary | ICD-10-CM | POA: Diagnosis not present

## 2021-02-27 DIAGNOSIS — K739 Chronic hepatitis, unspecified: Secondary | ICD-10-CM | POA: Diagnosis not present

## 2021-02-27 DIAGNOSIS — K8020 Calculus of gallbladder without cholecystitis without obstruction: Secondary | ICD-10-CM | POA: Diagnosis not present

## 2021-02-28 LAB — AFP TUMOR MARKER: AFP-Tumor Marker: 2.9 ng/mL

## 2021-08-07 DIAGNOSIS — I10 Essential (primary) hypertension: Secondary | ICD-10-CM | POA: Diagnosis not present

## 2021-08-07 DIAGNOSIS — E782 Mixed hyperlipidemia: Secondary | ICD-10-CM | POA: Diagnosis not present

## 2021-08-07 DIAGNOSIS — E7849 Other hyperlipidemia: Secondary | ICD-10-CM | POA: Diagnosis not present

## 2021-08-07 DIAGNOSIS — E038 Other specified hypothyroidism: Secondary | ICD-10-CM | POA: Diagnosis not present

## 2021-08-15 ENCOUNTER — Ambulatory Visit (INDEPENDENT_AMBULATORY_CARE_PROVIDER_SITE_OTHER): Payer: Medicare Other | Admitting: Gastroenterology

## 2021-08-16 DIAGNOSIS — Z1389 Encounter for screening for other disorder: Secondary | ICD-10-CM | POA: Diagnosis not present

## 2021-08-16 DIAGNOSIS — Z23 Encounter for immunization: Secondary | ICD-10-CM | POA: Diagnosis not present

## 2021-08-16 DIAGNOSIS — E7849 Other hyperlipidemia: Secondary | ICD-10-CM | POA: Diagnosis not present

## 2021-08-16 DIAGNOSIS — I10 Essential (primary) hypertension: Secondary | ICD-10-CM | POA: Diagnosis not present

## 2021-08-16 DIAGNOSIS — E039 Hypothyroidism, unspecified: Secondary | ICD-10-CM | POA: Diagnosis not present

## 2021-08-16 DIAGNOSIS — F17210 Nicotine dependence, cigarettes, uncomplicated: Secondary | ICD-10-CM | POA: Diagnosis not present

## 2021-09-11 IMAGING — US US ABDOMEN COMPLETE
1 series · 14 of 25 positions shown · non-contrast
Comparison: 03/06/2020

CLINICAL DATA: Abnormal LFTs, stage III fibrosis

EXAM:
ABDOMEN ULTRASOUND COMPLETE

[Series 1: us abdomen complete · 14 of 125 slices shown]
[im 1/125]
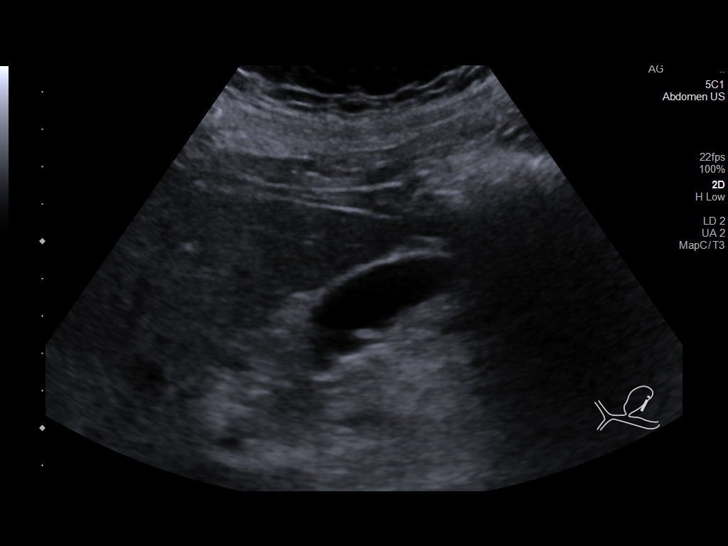
[im 11/125]
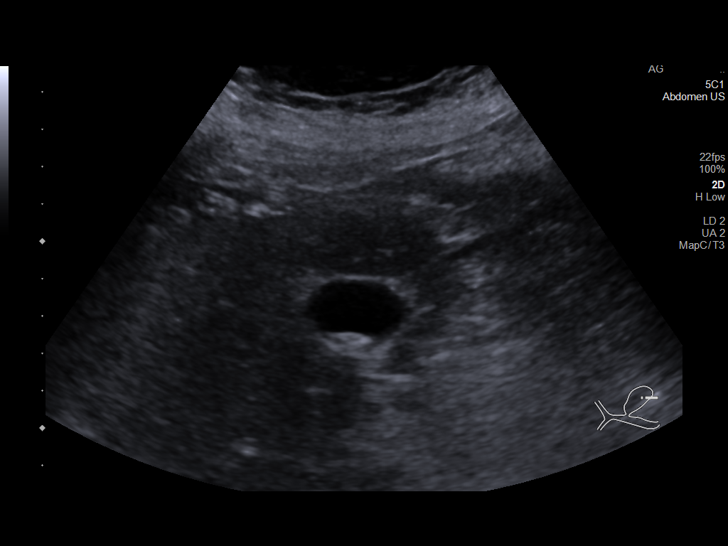
[im 21/125]
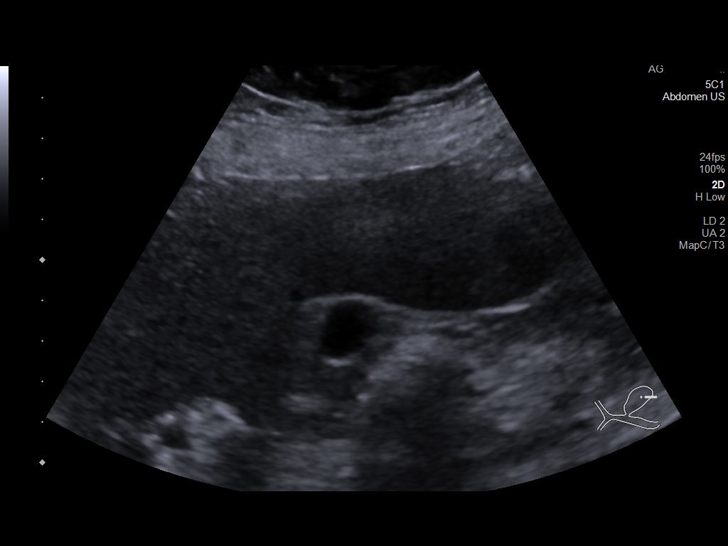
[im 32/125]
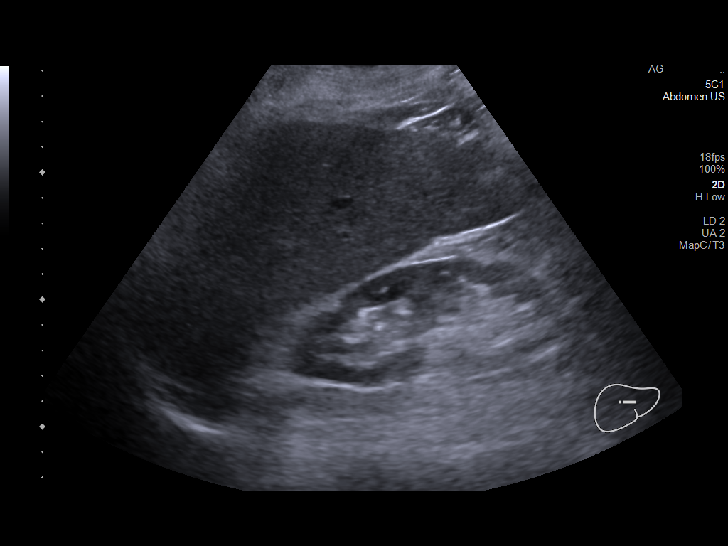
[im 42/125]
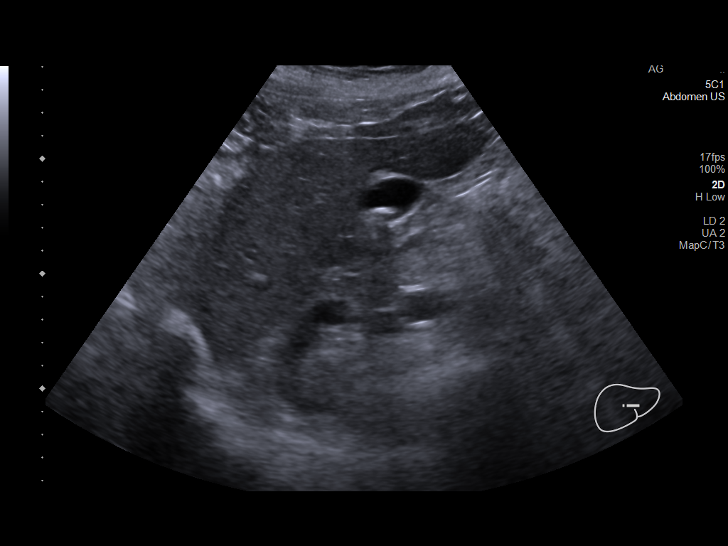
[im 47/125]
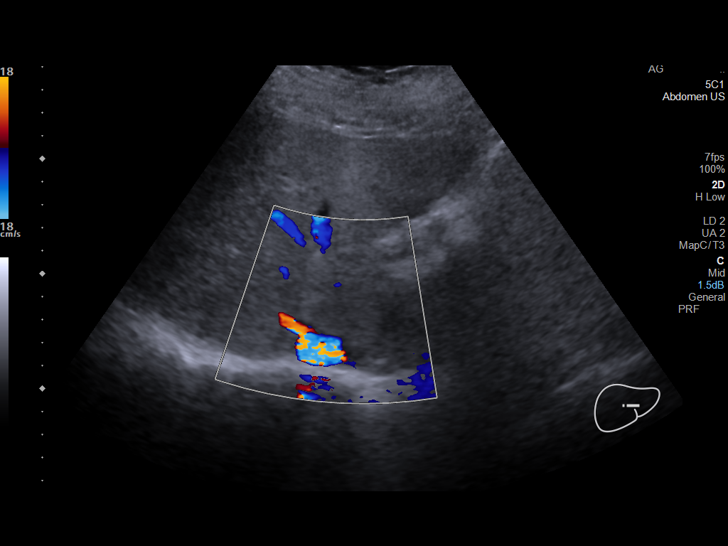
[im 57/125]
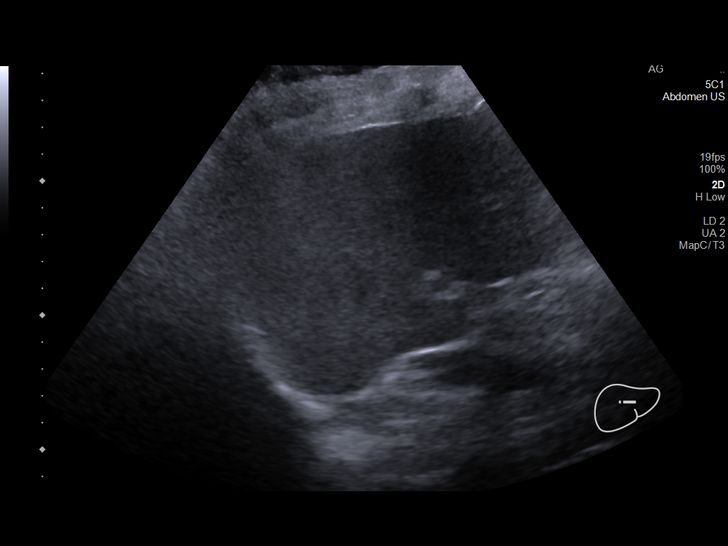
[im 68/125]
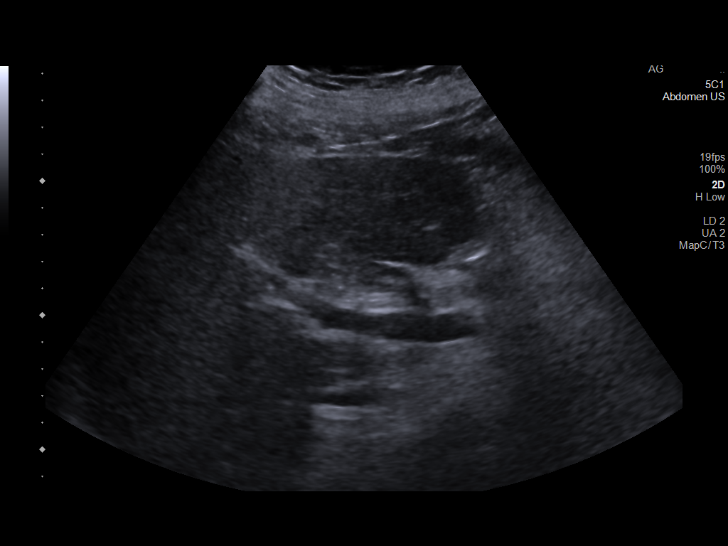
[im 78/125]
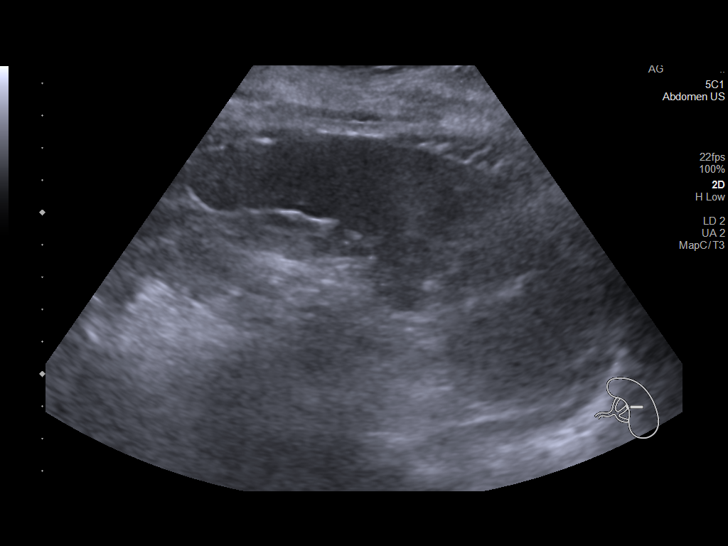
[im 83/125]
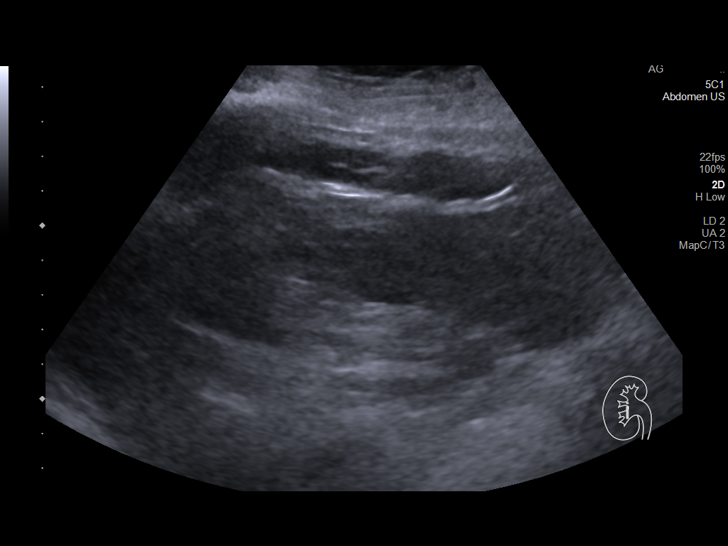
[im 94/125]
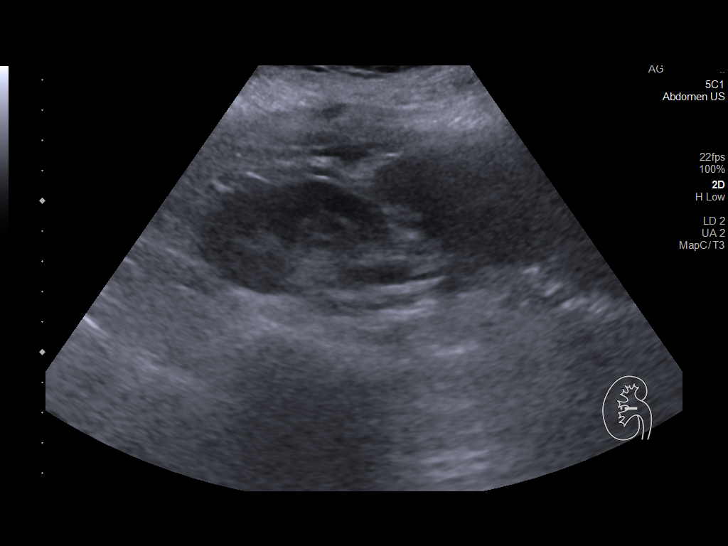
[im 104/125]
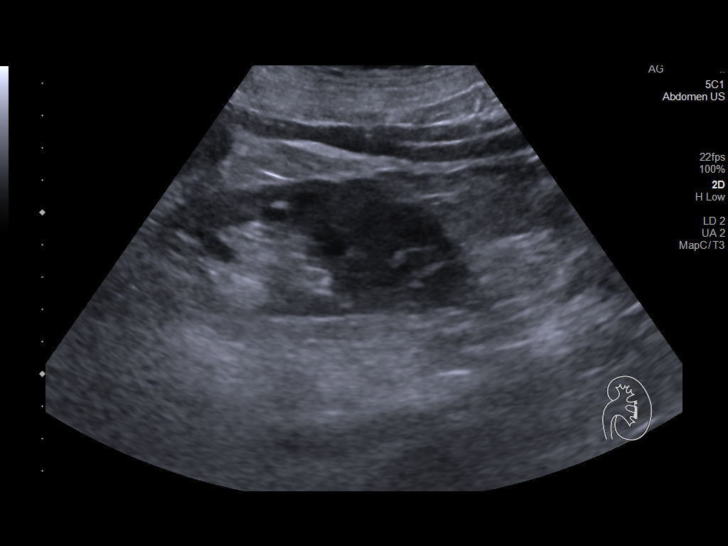
[im 114/125]
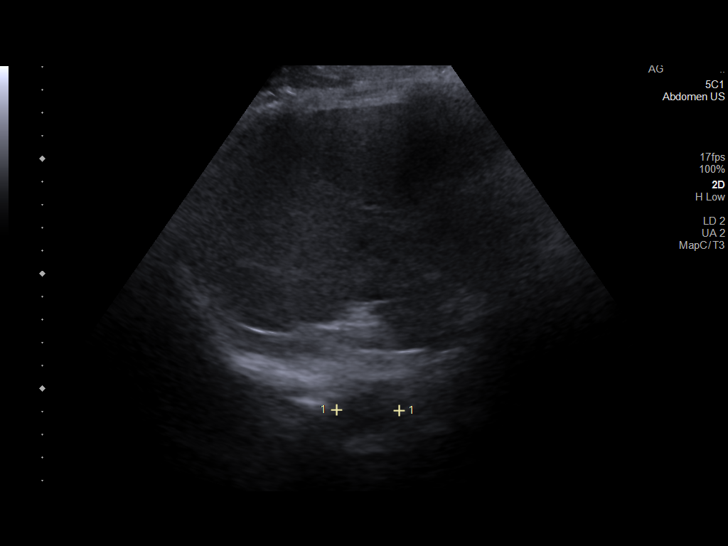
[im 125/125]
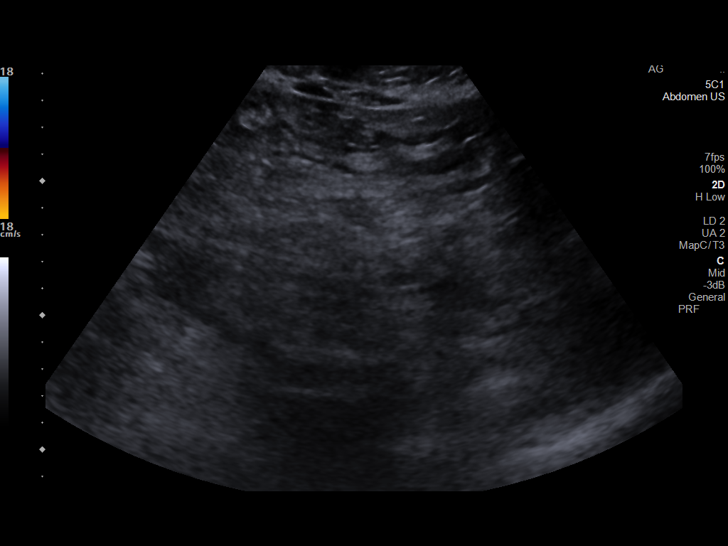

[14 of 25 positions shown; findings below may reference images not displayed]

FINDINGS: Gallbladder: Layering gallstones, measuring up to 11 mm. No
gallbladder wall thickening or pericholecystic fluid. Negative
sonographic Murphy's sign.

Common bile duct: Diameter: 3 mm

Liver: Nodular hepatic contour. Coarse hepatic echotexture. No focal
hepatic lesion is seen. Portal vein is patent on color Doppler
imaging with normal direction of blood flow towards the liver.

IVC: No abnormality visualized.

Pancreas: Visualized portion unremarkable.

Spleen: Size and appearance within normal limits.

Right Kidney: Length: 11.9 cm. Echogenicity within normal limits. No
mass or hydronephrosis visualized.

Left Kidney: Length: 11.1 cm. Echogenicity within normal limits. No
mass or hydronephrosis visualized.

Abdominal aorta: No aneurysm visualized.

Other findings: None.
IMPRESSION: Suspected mild cirrhosis.  No focal hepatic lesion is seen.

Cholelithiasis, without associated sonographic findings to suggest
acute cholecystitis.

## 2021-09-13 DIAGNOSIS — H00012 Hordeolum externum right lower eyelid: Secondary | ICD-10-CM | POA: Diagnosis not present

## 2021-10-18 ENCOUNTER — Other Ambulatory Visit: Payer: Self-pay | Admitting: Family Medicine

## 2021-10-18 DIAGNOSIS — Z1231 Encounter for screening mammogram for malignant neoplasm of breast: Secondary | ICD-10-CM

## 2021-11-25 ENCOUNTER — Other Ambulatory Visit: Payer: Self-pay

## 2021-11-25 ENCOUNTER — Encounter (INDEPENDENT_AMBULATORY_CARE_PROVIDER_SITE_OTHER): Payer: Self-pay | Admitting: Gastroenterology

## 2021-11-25 ENCOUNTER — Ambulatory Visit (INDEPENDENT_AMBULATORY_CARE_PROVIDER_SITE_OTHER): Payer: Medicare Other | Admitting: Gastroenterology

## 2021-11-25 VITALS — BP 178/80 | HR 85 | Temp 98.3°F | Ht 65.0 in | Wt 185.7 lb

## 2021-11-25 DIAGNOSIS — K7469 Other cirrhosis of liver: Secondary | ICD-10-CM

## 2021-11-25 DIAGNOSIS — Z1211 Encounter for screening for malignant neoplasm of colon: Secondary | ICD-10-CM

## 2021-11-25 DIAGNOSIS — Z1212 Encounter for screening for malignant neoplasm of rectum: Secondary | ICD-10-CM | POA: Diagnosis not present

## 2021-11-25 DIAGNOSIS — Z8619 Personal history of other infectious and parasitic diseases: Secondary | ICD-10-CM | POA: Diagnosis not present

## 2021-11-25 NOTE — Progress Notes (Signed)
[name], M.D. Gastroenterology & Hepatology [facility] [address] [place], [place] [address]  Primary Care Physician: Bianca Flavin, MD [address] [place] [address]  I will communicate my assessment and recommendations to the referring MD via EMR.  Problems: Liver cirrhosis secondary to hepatitis C Hepatitis C 1B status post Harvoni treatment -achieved SVR  History of Present Illness: [name] is a 70 y.o. female with Pmh hepatitis C 1b s/p 3 treatments with SVR after taking Harvoni in 2016 complicated by liver cirrhosis, GERD, hypertension,, who presents for follow up of hepatitis C and cirrhosis.  The patient was last seen on 08/14/2020. At that time, the patient was scheduled to undergo an abdominal ultrasound which was performed on and did not show any liver masses, but had nodularity changes suspicious for cirrhosis.  She had a repeat ultrasound on 02/27/2021 that did not show any masses as well, again with cirrhotic changes.  Last elastography was performed on 03/03/2016 which showed evidence of F3/F4 fibrosis.  Patient reports that when she had her liver biopsy performed in 2009 she was told she had cirrhosis.  She does not drink any alcohol.  Patient states that she has felt well and denies having any complaints.  The patient denies having any nausea, vomiting, fever, chills, hematochezia, melena, hematemesis, abdominal distention, abdominal pain, diarrhea, jaundice, pruritus or weight loss.  Has not present any lower extremity edema.  Last EGD: multiple years ago, performed by Dr. Karilyn Cota normal per patient but no reports available Last Colonoscopy:2009   FHx: neg for any gastrointestinal/liver disease, no malignancies Social:  smokes a pack a week, neg alcohol or illicit drug use   Past Medical History: Past Medical History:  Diagnosis Date   Anemia    Blood transfusion without reported diagnosis     GERD (gastroesophageal reflux disease)    Hepatitis C   Hypertension     Past Surgical History: Past Surgical History:  Procedure Laterality Date   CESAREAN SECTION  06/30/86   COLONOSCOPY  09/25/06   ROURK   LIVER BIOPSY  03/19/07   TONSILLECTOMY  1960   UPPER GASTROINTESTINAL ENDOSCOPY  07/20/2009   UPPER GASTROINTESTINAL ENDOSCOPY  09/25/06   ROURK    Family History: Family History  Problem Relation Age of Onset   Heart disease Mother    Heart disease Father    Cancer Father        lung   Healthy Sister    Healthy Daughter    Healthy Daughter    Healthy Son     Social History: Social History   Tobacco Use  Smoking Status Every Day   Packs/day: 1.00   Types: Cigarettes   Last attempt to quit: 10/08/2012   Years since quitting: 9.1  Smokeless Tobacco Never   Social History   Substance and Sexual Activity  Alcohol Use No   Alcohol/week: 0.0 standard drinks   Social History   Substance and Sexual Activity  Drug Use No    Allergies: No Known Allergies  Medications: Current Outpatient Medications  Medication Sig Dispense Refill   amLODipine (NORVASC) 5 MG tablet Take 10 mg by mouth daily.      calcium citrate-vitamin D (CITRACAL+D) 315-200 MG-UNIT tablet Take by mouth 2 (two) times daily.       metoprolol (TOPROL-XL) 50 MG 24 hr tablet Take 100 mg by mouth daily.      No current facility-administered medications for this visit.  Facility-Administered Medications Ordered in Other Visits  Medication Dose Route Frequency Provider Last Rate Last Admin   0.9 %  sodium chloride infusion   Intravenous Continuous Rehman, Joline Maxcy, MD        Review of Systems: GENERAL: negative for malaise, night sweats HEENT: No changes in hearing or vision, no nose bleeds or other nasal problems. NECK: Negative for lumps, goiter, pain and significant neck swelling RESPIRATORY: Negative for cough, wheezing CARDIOVASCULAR: Negative for chest pain, leg swelling,  palpitations, orthopnea GI: SEE HPI MUSCULOSKELETAL: Negative for joint pain or swelling, back pain, and muscle pain. SKIN: Negative for lesions, rash PSYCH: Negative for sleep disturbance, mood disorder and recent psychosocial stressors. HEMATOLOGY Negative for prolonged bleeding, bruising easily, and swollen nodes. ENDOCRINE: Negative for cold or heat intolerance, polyuria, polydipsia and goiter. NEURO: negative for tremor, gait imbalance, syncope and seizures. The remainder of the review of systems is noncontributory.   Physical Exam: BP (!) 178/80 (BP Location: Left Arm, Patient Position: Sitting, Cuff Size: Large)    Pulse 85    Temp 98.3 F (36.8 C) (Oral)    Ht 5\' 5"  (1.651 m)    Wt 185 lb 11.2 oz (84.2 kg)    BMI 30.90 kg/m  GENERAL: The patient is AO x3, in no acute distress. HEENT: Head is normocephalic and atraumatic. EOMI are intact. Mouth is well hydrated and without lesions. NECK: Supple. No masses LUNGS: Clear to auscultation. No presence of rhonchi/wheezing/rales. Adequate chest expansion HEART: RRR, normal s1 and s2. ABDOMEN: Soft, nontender, no guarding, no peritoneal signs, and nondistended. BS +. No masses. EXTREMITIES: Without any cyanosis, clubbing, rash, lesions or edema. NEUROLOGIC: AOx3, no focal motor deficit. SKIN: no jaundice, no rashes  Imaging/Labs: as above  I personally reviewed and interpreted the available labs, imaging and endoscopic files.  Impression and Plan: [name] is a 70 y.o. female with Pmh hepatitis C 1b s/p 3 treatments with SVR after taking Harvoni in 2016 complicated by liver cirrhosis, GERD, hypertension,, who presents for follow up of hepatitis C and cirrhosis.  The patient was effectively treated for hepatitis C with Harvoni in the past and achieved SVR.  However, due to long-term history, she may have developed cirrhosis which has been compensated through the years and has not presented any signs of portal hypertension.  We  will check an AFP, MELD labs, CBC and a liver ultrasound, which should be repeated every 6 months.  The patient understood and agreed.  - Schedule liver 2017 - check an AFP, MELD labs, CBC  All questions were answered.      [place], MD Gastroenterology and Hepatology [facility]

## 2021-11-25 NOTE — Patient Instructions (Signed)
Schedule liver US Perform blood and stool workup

## 2021-11-26 DIAGNOSIS — Z1231 Encounter for screening mammogram for malignant neoplasm of breast: Secondary | ICD-10-CM | POA: Diagnosis not present

## 2021-11-26 LAB — CBC WITH DIFFERENTIAL/PLATELET
Absolute Monocytes: 646 cells/uL (ref 200–950)
Basophils Absolute: 57 cells/uL (ref 0–200)
Basophils Relative: 0.6 %
Eosinophils Absolute: 181 cells/uL (ref 15–500)
Eosinophils Relative: 1.9 %
HCT: 39.6 % (ref 35.0–45.0)
Hemoglobin: 12.3 g/dL (ref 11.7–15.5)
Lymphs Abs: 2375 cells/uL (ref 850–3900)
MCH: 20 pg — ABNORMAL LOW (ref 27.0–33.0)
MCHC: 31.1 g/dL — ABNORMAL LOW (ref 32.0–36.0)
MCV: 64.5 fL — ABNORMAL LOW (ref 80.0–100.0)
MPV: 10.9 fL (ref 7.5–12.5)
Monocytes Relative: 6.8 %
Neutro Abs: 6242 cells/uL (ref 1500–7800)
Neutrophils Relative %: 65.7 %
Platelets: 261 10*3/uL (ref 140–400)
RBC: 6.14 10*6/uL — ABNORMAL HIGH (ref 3.80–5.10)
RDW: 17.2 % — ABNORMAL HIGH (ref 11.0–15.0)
Total Lymphocyte: 25 %
WBC: 9.5 10*3/uL (ref 3.8–10.8)

## 2021-11-26 LAB — COMPREHENSIVE METABOLIC PANEL
AG Ratio: 1.3 (calc) (ref 1.0–2.5)
ALT: 12 U/L (ref 6–29)
AST: 14 U/L (ref 10–35)
Albumin: 4.1 g/dL (ref 3.6–5.1)
Alkaline phosphatase (APISO): 64 U/L (ref 37–153)
BUN: 15 mg/dL (ref 7–25)
CO2: 24 mmol/L (ref 20–32)
Calcium: 9.3 mg/dL (ref 8.6–10.4)
Chloride: 106 mmol/L (ref 98–110)
Creat: 0.74 mg/dL (ref 0.50–1.05)
Globulin: 3.1 g/dL (calc) (ref 1.9–3.7)
Glucose, Bld: 93 mg/dL (ref 65–99)
Potassium: 4.7 mmol/L (ref 3.5–5.3)
Sodium: 139 mmol/L (ref 135–146)
Total Bilirubin: 0.7 mg/dL (ref 0.2–1.2)
Total Protein: 7.2 g/dL (ref 6.1–8.1)

## 2021-11-26 LAB — AFP TUMOR MARKER: AFP-Tumor Marker: 2.7 ng/mL

## 2021-11-26 LAB — PROTIME-INR
INR: 1
Prothrombin Time: 10.3 s (ref 9.0–11.5)

## 2021-11-26 LAB — CBC MORPHOLOGY

## 2021-12-03 ENCOUNTER — Other Ambulatory Visit: Payer: Self-pay

## 2021-12-03 ENCOUNTER — Ambulatory Visit (HOSPITAL_COMMUNITY)
Admission: RE | Admit: 2021-12-03 | Discharge: 2021-12-03 | Disposition: A | Discharge status: Home / Self Care | Payer: Medicare Other | Source: Ambulatory Visit | Attending: Gastroenterology | Admitting: Gastroenterology

## 2021-12-03 DIAGNOSIS — K7469 Other cirrhosis of liver: Secondary | ICD-10-CM | POA: Diagnosis not present

## 2021-12-03 DIAGNOSIS — Z8619 Personal history of other infectious and parasitic diseases: Secondary | ICD-10-CM | POA: Diagnosis not present

## 2021-12-03 DIAGNOSIS — K760 Fatty (change of) liver, not elsewhere classified: Secondary | ICD-10-CM | POA: Diagnosis not present

## 2021-12-11 DIAGNOSIS — M791 Myalgia, unspecified site: Secondary | ICD-10-CM | POA: Diagnosis not present

## 2021-12-11 DIAGNOSIS — J069 Acute upper respiratory infection, unspecified: Secondary | ICD-10-CM | POA: Diagnosis not present

## 2021-12-11 DIAGNOSIS — Z20822 Contact with and (suspected) exposure to covid-19: Secondary | ICD-10-CM | POA: Diagnosis not present

## 2021-12-19 DIAGNOSIS — R922 Inconclusive mammogram: Secondary | ICD-10-CM | POA: Diagnosis not present

## 2021-12-19 DIAGNOSIS — R928 Other abnormal and inconclusive findings on diagnostic imaging of breast: Secondary | ICD-10-CM | POA: Diagnosis not present

## 2021-12-26 DIAGNOSIS — Z1211 Encounter for screening for malignant neoplasm of colon: Secondary | ICD-10-CM | POA: Diagnosis not present

## 2021-12-26 DIAGNOSIS — Z1212 Encounter for screening for malignant neoplasm of rectum: Secondary | ICD-10-CM | POA: Diagnosis not present

## 2022-01-02 LAB — COLOGUARD: COLOGUARD: NEGATIVE

## 2022-01-03 DIAGNOSIS — J0101 Acute recurrent maxillary sinusitis: Secondary | ICD-10-CM | POA: Diagnosis not present

## 2022-02-17 DIAGNOSIS — E7849 Other hyperlipidemia: Secondary | ICD-10-CM | POA: Diagnosis not present

## 2022-02-17 DIAGNOSIS — I10 Essential (primary) hypertension: Secondary | ICD-10-CM | POA: Diagnosis not present

## 2022-02-17 DIAGNOSIS — E782 Mixed hyperlipidemia: Secondary | ICD-10-CM | POA: Diagnosis not present

## 2022-02-17 DIAGNOSIS — Z1329 Encounter for screening for other suspected endocrine disorder: Secondary | ICD-10-CM | POA: Diagnosis not present

## 2022-02-20 DIAGNOSIS — J0101 Acute recurrent maxillary sinusitis: Secondary | ICD-10-CM | POA: Diagnosis not present

## 2022-02-20 DIAGNOSIS — E039 Hypothyroidism, unspecified: Secondary | ICD-10-CM | POA: Diagnosis not present

## 2022-02-20 DIAGNOSIS — I10 Essential (primary) hypertension: Secondary | ICD-10-CM | POA: Diagnosis not present

## 2022-02-20 DIAGNOSIS — E7849 Other hyperlipidemia: Secondary | ICD-10-CM | POA: Diagnosis not present

## 2022-03-21 IMAGING — US US ABDOMEN COMPLETE
1 series · 14 of 25 positions shown · non-contrast
Comparison: Abdominal ultrasound August 20, 2020

CLINICAL DATA: Follow-up suspected cirrhosis.

EXAM:
ABDOMEN ULTRASOUND COMPLETE

[Series 1: us abdomen complete · 14 of 108 slices shown]
[im 1/108]
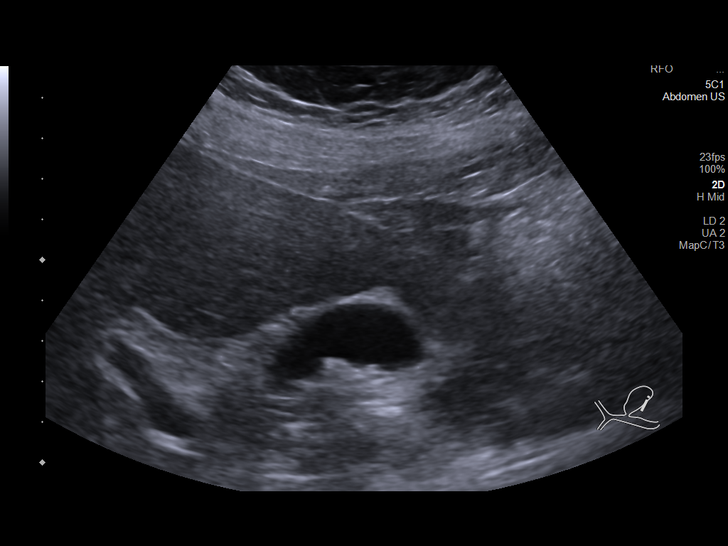
[im 9/108]
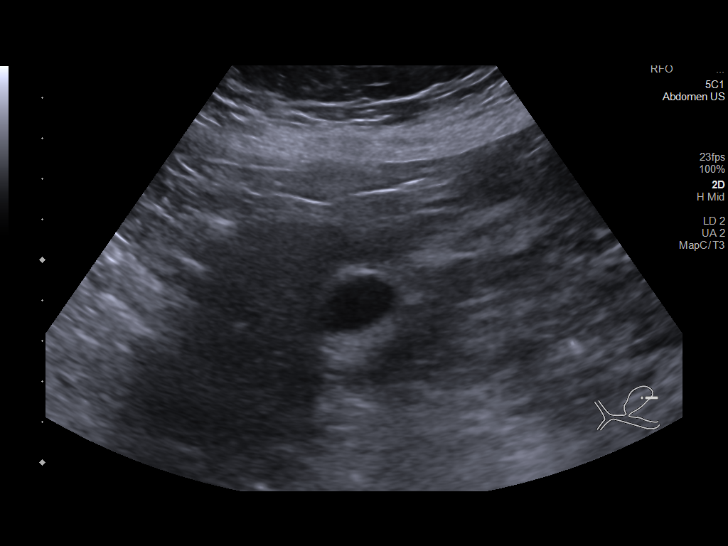
[im 18/108]
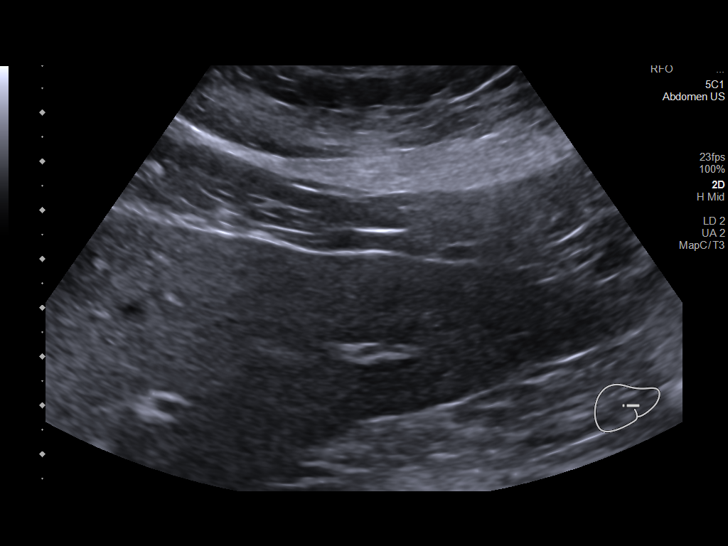
[im 27/108]
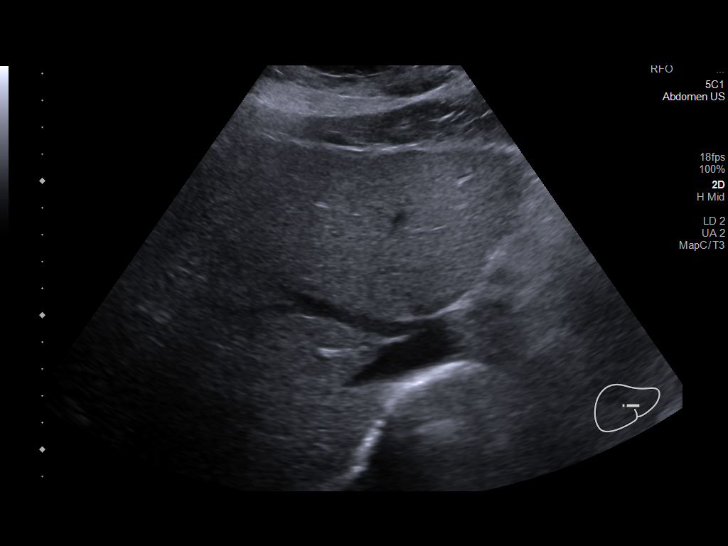
[im 36/108]
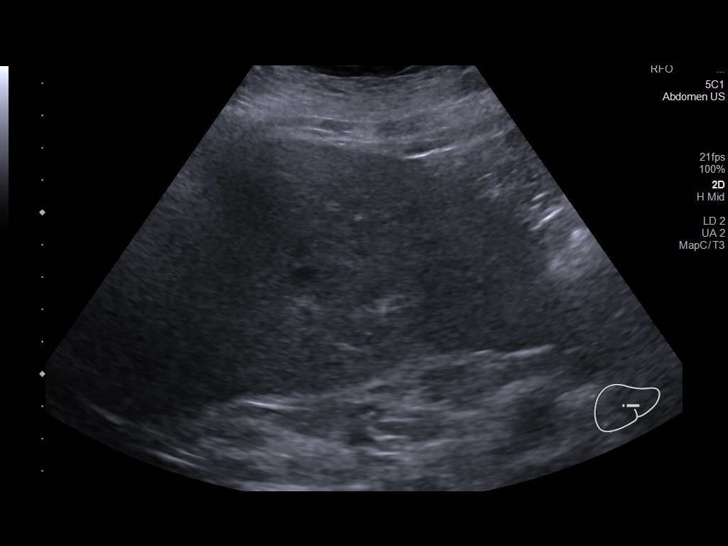
[im 41/108]
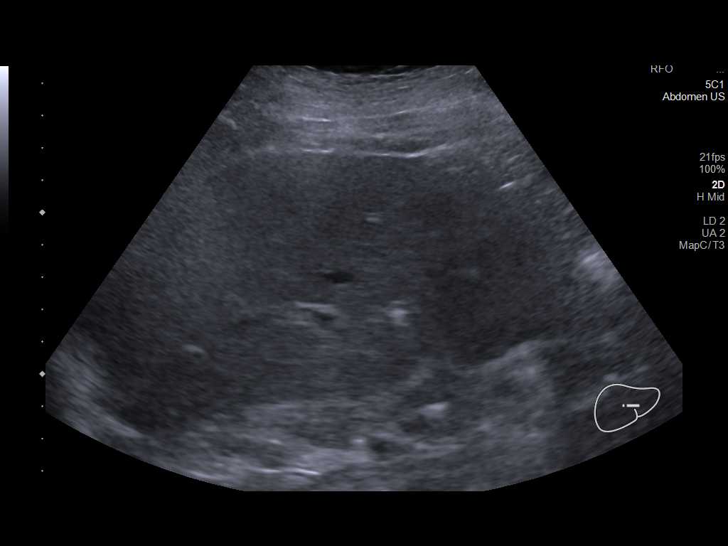
[im 50/108]
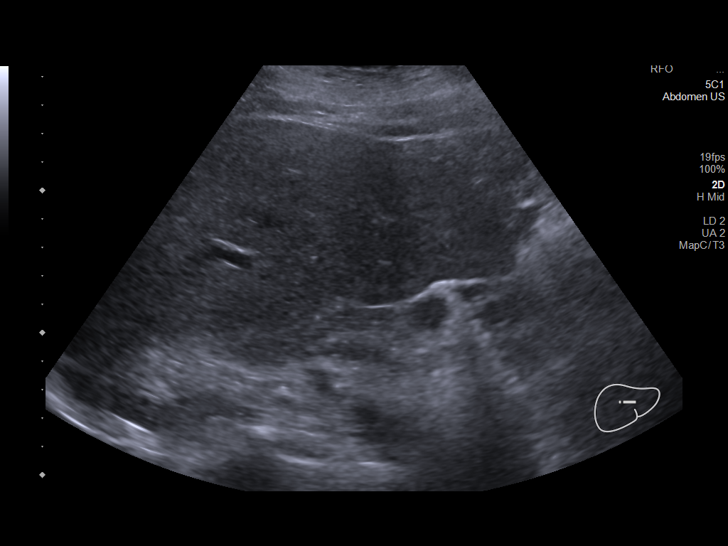
[im 58/108]
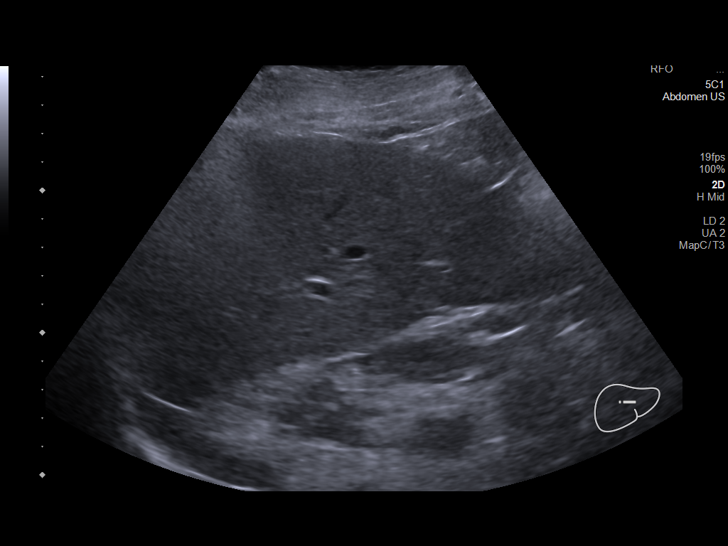
[im 67/108]
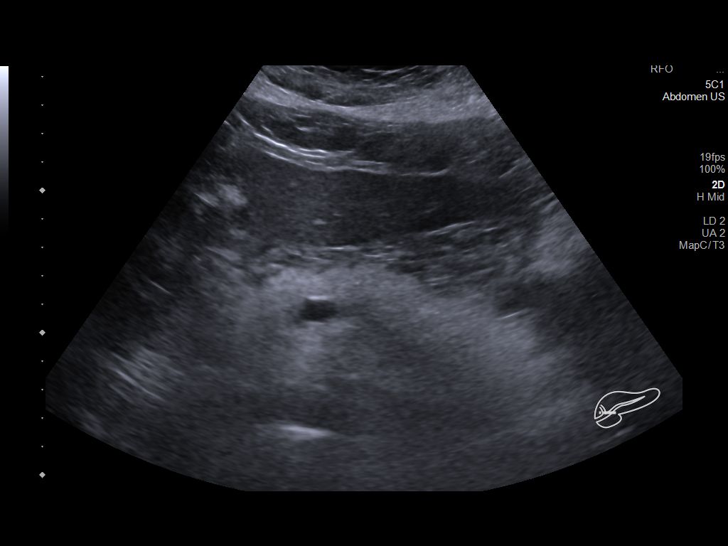
[im 72/108]
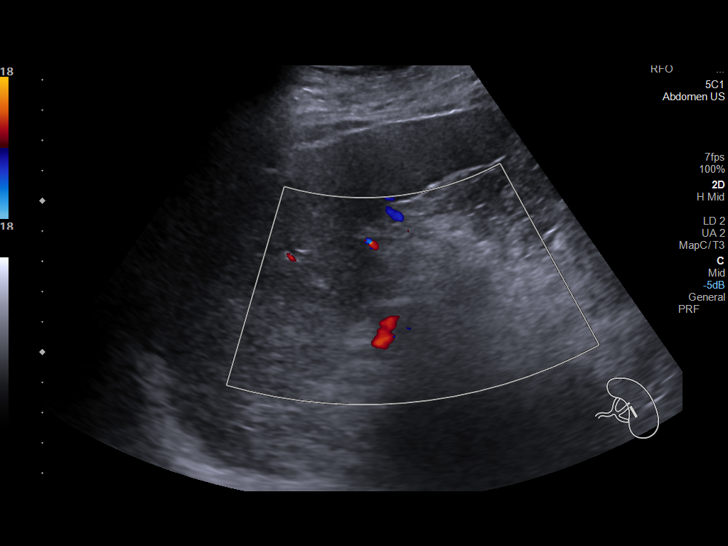
[im 81/108]
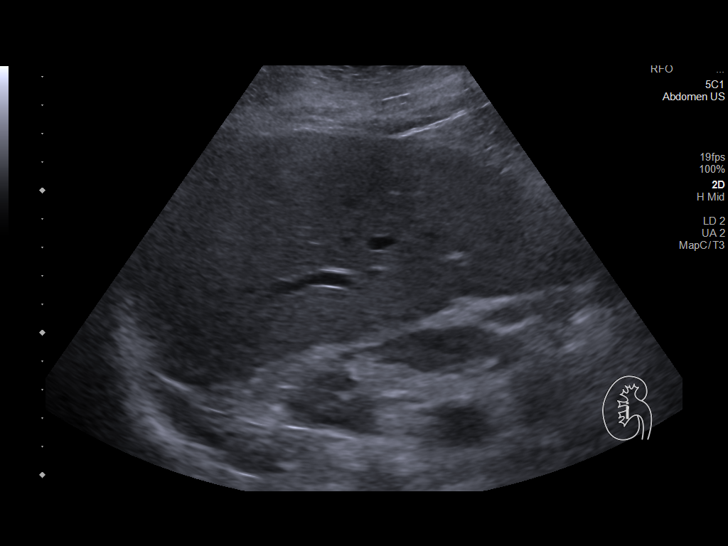
[im 90/108]
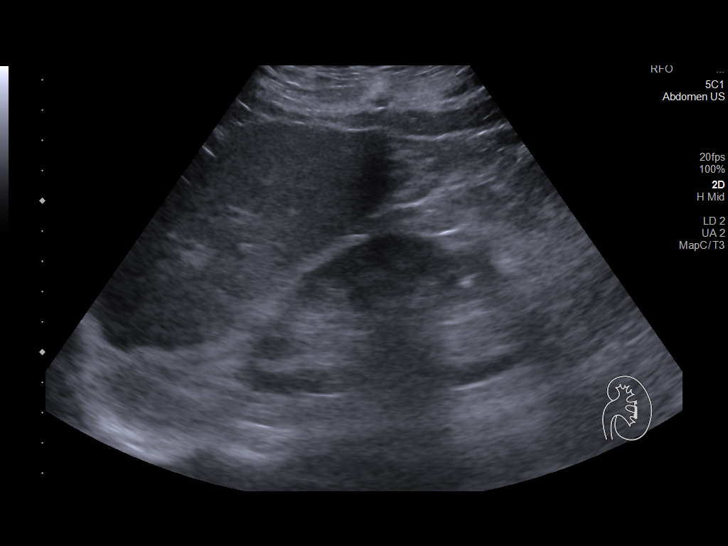
[im 99/108]
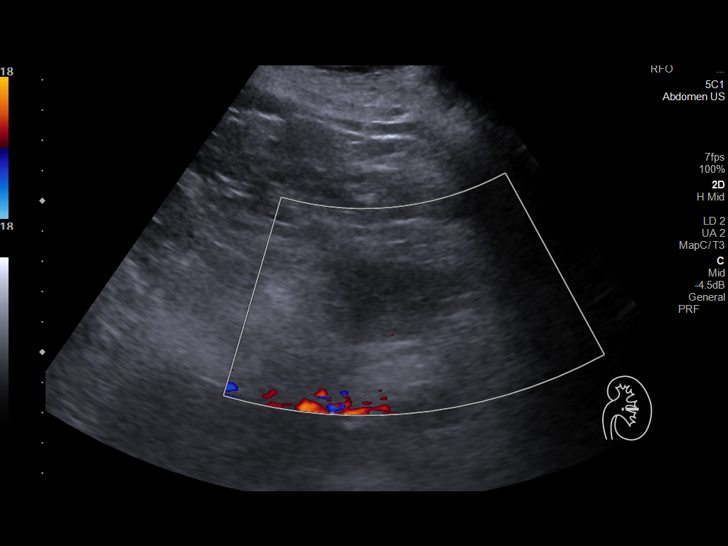
[im 108/108]
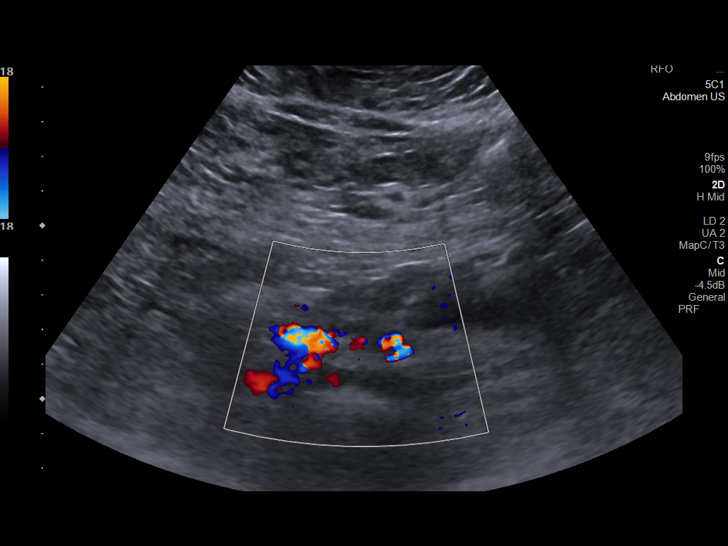

[14 of 25 positions shown; findings below may reference images not displayed]

FINDINGS: Gallbladder: Cholelithiasis measuring up to 7 mm. No pericholecystic
fluid or wall thickening visualized. No sonographic Murphy sign
noted by sonographer.

Common bile duct: Diameter: 3 mm

Liver: No focal lesion identified. Coarsened echotexture with
nodular hepatic contour and increased parenchymal echogenicity.
Portal vein is patent on color Doppler imaging with normal direction
of blood flow towards the liver.

IVC: No abnormality visualized.

Pancreas: Visualized portion unremarkable.

Spleen: Borderline splenomegaly measuring up to 12.7 cm, similar
prior.

Right Kidney: Length: 11.8 cm. Echogenicity within normal limits. No
mass or hydronephrosis visualized.

Left Kidney: Length: 10.2 cm. Echogenicity within normal limits. No
mass or hydronephrosis visualized.

Abdominal aorta: No aneurysm visualized.

Other findings: None.
IMPRESSION: 1. Cirrhotic morphology of the liver. No focal hepatic lesion
identified.
2. Cholelithiasis without findings of acute cholecystitis

## 2022-05-26 ENCOUNTER — Ambulatory Visit (INDEPENDENT_AMBULATORY_CARE_PROVIDER_SITE_OTHER): Payer: Medicare Other | Admitting: Gastroenterology

## 2022-05-26 ENCOUNTER — Encounter (INDEPENDENT_AMBULATORY_CARE_PROVIDER_SITE_OTHER): Payer: Self-pay | Admitting: Gastroenterology

## 2022-05-26 VITALS — BP 158/75 | HR 73 | Temp 97.5°F | Ht 65.0 in | Wt 186.4 lb

## 2022-05-26 DIAGNOSIS — K7469 Other cirrhosis of liver: Secondary | ICD-10-CM

## 2022-05-26 DIAGNOSIS — Z8619 Personal history of other infectious and parasitic diseases: Secondary | ICD-10-CM | POA: Diagnosis not present

## 2022-05-26 NOTE — Patient Instructions (Signed)
-   Check CBC, MELD labs and AFP - Schedule liver US - Reduce salt intake to <2 g per day - Can take Tylenol max of 2 g per day (650 mg q8h) for pain - Avoid NSAIDs for pain - Avoid eating raw oysters/shellfish - Protein shake (Ensure or Boost) every night before going to sleep

## 2022-05-26 NOTE — Progress Notes (Signed)
Katrinka Blazing, M.D. Gastroenterology & Hepatology [facility] [address] [place], [place] [address]  Primary Care Physician: Selinda Flavin, MD [address] [place] [address]  I will communicate my assessment and recommendations to the referring MD via EMR.  Problems: Liver cirrhosis secondary to hepatitis C Hepatitis C 1B status post Harvoni treatment -achieved SVR  History of Present Illness: [name] is a 71 y.o. female with Pmh hepatitis C 1b s/p 3 treatments with SVR after taking Harvoni in 2016 complicated by liver cirrhosis, GERD, hypertension,, who presents for follow up of hepatitis C and cirrhosis.  The patient was last seen on 11/25/2021. At that time, the patient was ordered to have MELD labs, AFP, liver ultrasound and CBC.  Patient reports that for the last couple she has occasional episodes of pain in her RUQ, usually at night that gets better when she moves around. States the episodes are self limited. No nausea, fever, or vomiting when these episodes happen.  The patient denies having any nausea, vomiting, fever, chills, hematochezia, melena, hematemesis, abdominal distention, diarrhea, jaundice, pruritus or weight loss.  Cirrhosis related questions: Hematemesis/coffee ground emesis: No History of variceal bleeding: No Abdominal pain: As above Abdominal distention/worsening ascitesNo Fever/chills: No Episodes of confusion/disorientation: No Taking diuretics?: No Prior history of banding?: No Prior episodes of SBP: No Last time liver imaging was performed: Abdominal ultrasound 12/03/2021, no liver masses, there was presence of gallbladder stones MELD score: 11/25/21 - 7  Last EGD: multiple years ago, performed by Dr. Karilyn Cota normal per patient but no reports available Last Colonoscopy:2009  Had negative Cologuard on 12/26/2021  Past Medical History: Past Medical History:  Diagnosis Date   Anemia     Blood transfusion without reported diagnosis    GERD (gastroesophageal reflux disease)    Hepatitis C   Hypertension     Past Surgical History: Past Surgical History:  Procedure Laterality Date   CESAREAN SECTION  06/30/86   COLONOSCOPY  09/25/06   ROURK   LIVER BIOPSY  03/19/07   TONSILLECTOMY  1960   UPPER GASTROINTESTINAL ENDOSCOPY  07/20/2009   UPPER GASTROINTESTINAL ENDOSCOPY  09/25/06   ROURK    Family History: Family History  Problem Relation Age of Onset   Heart disease Mother    Heart disease Father    Cancer Father        lung   Healthy Sister    Healthy Daughter    Healthy Daughter    Healthy Son     Social History: Social History   Tobacco Use  Smoking Status Every Day   Packs/day: 1.00   Types: Cigarettes   Last attempt to quit: 10/08/2012   Years since quitting: 9.6   Passive exposure: Past  Smokeless Tobacco Never   Social History   Substance and Sexual Activity  Alcohol Use No   Alcohol/week: 0.0 standard drinks of alcohol   Social History   Substance and Sexual Activity  Drug Use No    Allergies: No Known Allergies  Medications: Current Outpatient Medications  Medication Sig Dispense Refill   amLODipine (NORVASC) 5 MG tablet Take 10 mg by mouth daily.      calcium citrate-vitamin D (CITRACAL+D) 315-200 MG-UNIT tablet Take by mouth 2 (two) times daily.       metoprolol succinate (TOPROL-XL) 100 MG 24 hr tablet Take 100 mg by mouth daily.      OVER THE COUNTER MEDICATION Vit C 1,000mg   one daily  zinc gluconate 50 MG tablet Take 50 mg by mouth daily.     No current facility-administered medications for this visit.   Facility-Administered Medications Ordered in Other Visits  Medication Dose Route Frequency Provider Last Rate Last Admin   0.9 %  sodium chloride infusion   Intravenous Continuous Rehman, Joline Maxcy, MD        Review of Systems: GENERAL: negative for malaise, night sweats HEENT: No changes in hearing or vision,  no nose bleeds or other nasal problems. NECK: Negative for lumps, goiter, pain and significant neck swelling RESPIRATORY: Negative for cough, wheezing CARDIOVASCULAR: Negative for chest pain, leg swelling, palpitations, orthopnea GI: SEE HPI MUSCULOSKELETAL: Negative for joint pain or swelling, back pain, and muscle pain. SKIN: Negative for lesions, rash PSYCH: Negative for sleep disturbance, mood disorder and recent psychosocial stressors. HEMATOLOGY Negative for prolonged bleeding, bruising easily, and swollen nodes. ENDOCRINE: Negative for cold or heat intolerance, polyuria, polydipsia and goiter. NEURO: negative for tremor, gait imbalance, syncope and seizures. The remainder of the review of systems is noncontributory.   Physical Exam: BP (!) 158/75 (BP Location: Right Arm, Patient Position: Sitting, Cuff Size: Large)   Pulse 73   Temp (!) 97.5 F (36.4 C) (Oral)   Ht 5\' 5"  (1.651 m)   Wt 186 lb 6.4 oz (84.6 kg)   BMI 31.02 kg/m  GENERAL: The patient is AO x3, in no acute distress. HEENT: Head is normocephalic and atraumatic. EOMI are intact. Mouth is well hydrated and without lesions. NECK: Supple. No masses LUNGS: Clear to auscultation. No presence of rhonchi/wheezing/rales. Adequate chest expansion HEART: RRR, normal s1 and s2. ABDOMEN: Soft, nontender, no guarding, no peritoneal signs, and nondistended. BS +. No masses. EXTREMITIES: Without any cyanosis, clubbing, rash, lesions or edema. NEUROLOGIC: AOx3, no focal motor deficit. SKIN: no jaundice, no rashes  Imaging/Labs: as above  I personally reviewed and interpreted the available labs, imaging and endoscopic files.  Impression and Plan: [name] is a 71 y.o. female with Pmh hepatitis C 1b s/p 3 treatments with SVR after taking Harvoni in 2016 complicated by liver cirrhosis, GERD, hypertension, who presents for follow up of hepatitis C and cirrhosis.  The patient has been asymptomatic and she has not presented  any decompensating event.  She is a CPT class A patient with a low MELD score.  Throughout the years, she has remained stable without any significant signs of progression of her liver disease she has avoided taking any insulting agents such as alcohol.  We will update her surveillance labs today and HCC screening.  - Check CBC, MELD labs and AFP - Schedule liver 2017 - Reduce salt intake to <2 g per day - Can take Tylenol max of 2 g per day (650 mg q8h) for pain - Avoid NSAIDs for pain - Avoid eating raw oysters/shellfish - Protein shake (Ensure or Boost) every night before going to sleep  All questions were answered.      [place], MD Gastroenterology and Hepatology [facility]

## 2022-06-05 ENCOUNTER — Other Ambulatory Visit (HOSPITAL_COMMUNITY): Payer: Medicare Other

## 2022-06-11 DIAGNOSIS — Z8619 Personal history of other infectious and parasitic diseases: Secondary | ICD-10-CM | POA: Diagnosis not present

## 2022-06-11 DIAGNOSIS — K7469 Other cirrhosis of liver: Secondary | ICD-10-CM | POA: Diagnosis not present

## 2022-06-12 ENCOUNTER — Ambulatory Visit (HOSPITAL_COMMUNITY)
Admission: RE | Admit: 2022-06-12 | Discharge: 2022-06-12 | Disposition: A | Discharge status: Home / Self Care | Payer: Medicare Other | Source: Ambulatory Visit | Attending: Gastroenterology | Admitting: Gastroenterology

## 2022-06-12 DIAGNOSIS — K7460 Unspecified cirrhosis of liver: Secondary | ICD-10-CM | POA: Diagnosis not present

## 2022-06-12 DIAGNOSIS — K7469 Other cirrhosis of liver: Secondary | ICD-10-CM | POA: Insufficient documentation

## 2022-06-12 DIAGNOSIS — Z8619 Personal history of other infectious and parasitic diseases: Secondary | ICD-10-CM | POA: Insufficient documentation

## 2022-06-12 LAB — CBC WITH DIFFERENTIAL/PLATELET
Absolute Monocytes: 596 cells/uL (ref 200–950)
Basophils Absolute: 71 cells/uL (ref 0–200)
Basophils Relative: 0.7 %
Eosinophils Absolute: 232 cells/uL (ref 15–500)
Eosinophils Relative: 2.3 %
HCT: 38.2 % (ref 35.0–45.0)
Hemoglobin: 11.7 g/dL (ref 11.7–15.5)
Lymphs Abs: 2060 cells/uL (ref 850–3900)
MCH: 21 pg — ABNORMAL LOW (ref 27.0–33.0)
MCHC: 30.6 g/dL — ABNORMAL LOW (ref 32.0–36.0)
MCV: 68.7 fL — ABNORMAL LOW (ref 80.0–100.0)
MPV: 11.5 fL (ref 7.5–12.5)
Monocytes Relative: 5.9 %
Neutro Abs: 7141 cells/uL (ref 1500–7800)
Neutrophils Relative %: 70.7 %
Platelets: 269 10*3/uL (ref 140–400)
RBC: 5.56 10*6/uL — ABNORMAL HIGH (ref 3.80–5.10)
RDW: 17 % — ABNORMAL HIGH (ref 11.0–15.0)
Total Lymphocyte: 20.4 %
WBC: 10.1 10*3/uL (ref 3.8–10.8)

## 2022-06-12 LAB — COMPREHENSIVE METABOLIC PANEL
AG Ratio: 1.5 (calc) (ref 1.0–2.5)
ALT: 11 U/L (ref 6–29)
AST: 12 U/L (ref 10–35)
Albumin: 4.3 g/dL (ref 3.6–5.1)
Alkaline phosphatase (APISO): 62 U/L (ref 37–153)
BUN: 16 mg/dL (ref 7–25)
CO2: 24 mmol/L (ref 20–32)
Calcium: 9.2 mg/dL (ref 8.6–10.4)
Chloride: 108 mmol/L (ref 98–110)
Creat: 0.79 mg/dL (ref 0.50–1.05)
Globulin: 2.9 g/dL (calc) (ref 1.9–3.7)
Glucose, Bld: 114 mg/dL — ABNORMAL HIGH (ref 65–99)
Potassium: 4.4 mmol/L (ref 3.5–5.3)
Sodium: 142 mmol/L (ref 135–146)
Total Bilirubin: 0.7 mg/dL (ref 0.2–1.2)
Total Protein: 7.2 g/dL (ref 6.1–8.1)

## 2022-06-12 LAB — AFP TUMOR MARKER: AFP-Tumor Marker: 3.4 ng/mL

## 2022-06-12 LAB — CBC MORPHOLOGY

## 2022-06-12 LAB — PROTIME-INR
INR: 1
Prothrombin Time: 10.4 s (ref 9.0–11.5)

## 2022-06-16 DIAGNOSIS — J209 Acute bronchitis, unspecified: Secondary | ICD-10-CM | POA: Diagnosis not present

## 2022-06-16 DIAGNOSIS — J019 Acute sinusitis, unspecified: Secondary | ICD-10-CM | POA: Diagnosis not present

## 2022-06-16 DIAGNOSIS — L089 Local infection of the skin and subcutaneous tissue, unspecified: Secondary | ICD-10-CM | POA: Diagnosis not present

## 2022-06-16 DIAGNOSIS — R030 Elevated blood-pressure reading, without diagnosis of hypertension: Secondary | ICD-10-CM | POA: Diagnosis not present

## 2022-08-13 DIAGNOSIS — I10 Essential (primary) hypertension: Secondary | ICD-10-CM | POA: Diagnosis not present

## 2022-08-13 DIAGNOSIS — E039 Hypothyroidism, unspecified: Secondary | ICD-10-CM | POA: Diagnosis not present

## 2022-08-13 DIAGNOSIS — D519 Vitamin B12 deficiency anemia, unspecified: Secondary | ICD-10-CM | POA: Diagnosis not present

## 2022-08-13 DIAGNOSIS — Z1329 Encounter for screening for other suspected endocrine disorder: Secondary | ICD-10-CM | POA: Diagnosis not present

## 2022-08-13 DIAGNOSIS — E7849 Other hyperlipidemia: Secondary | ICD-10-CM | POA: Diagnosis not present

## 2022-08-13 DIAGNOSIS — D649 Anemia, unspecified: Secondary | ICD-10-CM | POA: Diagnosis not present

## 2022-08-13 DIAGNOSIS — D529 Folate deficiency anemia, unspecified: Secondary | ICD-10-CM | POA: Diagnosis not present

## 2022-08-25 DIAGNOSIS — E039 Hypothyroidism, unspecified: Secondary | ICD-10-CM | POA: Diagnosis not present

## 2022-08-25 DIAGNOSIS — I10 Essential (primary) hypertension: Secondary | ICD-10-CM | POA: Diagnosis not present

## 2022-08-25 DIAGNOSIS — E7849 Other hyperlipidemia: Secondary | ICD-10-CM | POA: Diagnosis not present

## 2022-08-25 DIAGNOSIS — Z23 Encounter for immunization: Secondary | ICD-10-CM | POA: Diagnosis not present

## 2022-08-25 DIAGNOSIS — F17210 Nicotine dependence, cigarettes, uncomplicated: Secondary | ICD-10-CM | POA: Diagnosis not present

## 2022-08-25 DIAGNOSIS — Z0000 Encounter for general adult medical examination without abnormal findings: Secondary | ICD-10-CM | POA: Diagnosis not present

## 2022-09-08 DIAGNOSIS — Z23 Encounter for immunization: Secondary | ICD-10-CM | POA: Diagnosis not present

## 2022-11-12 DIAGNOSIS — R059 Cough, unspecified: Secondary | ICD-10-CM | POA: Diagnosis not present

## 2022-11-12 DIAGNOSIS — J0100 Acute maxillary sinusitis, unspecified: Secondary | ICD-10-CM | POA: Diagnosis not present

## 2022-11-12 DIAGNOSIS — R030 Elevated blood-pressure reading, without diagnosis of hypertension: Secondary | ICD-10-CM | POA: Diagnosis not present

## 2022-12-25 IMAGING — US US ABDOMEN LIMITED
1 series · 14 of 25 positions shown · non-contrast
Comparison: 02/27/2021

CLINICAL DATA: Fatty liver, gallbladder stones.

EXAM:
ULTRASOUND ABDOMEN LIMITED RIGHT UPPER QUADRANT

[Series 1: us abdomen limited ruq (liver/gb) · 14 of 56 slices shown]
[im 1/56]
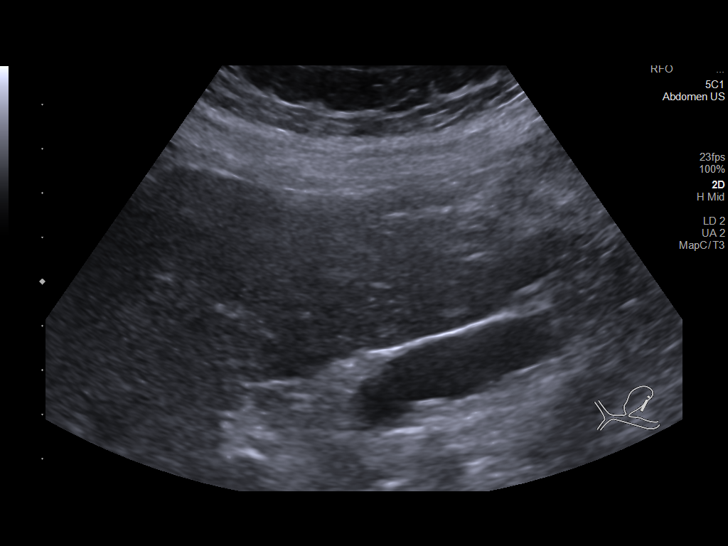
[im 5/56]
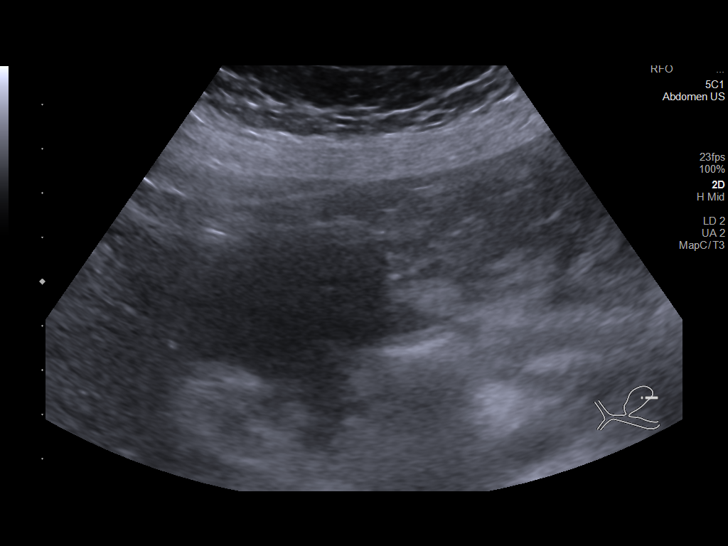
[im 10/56]
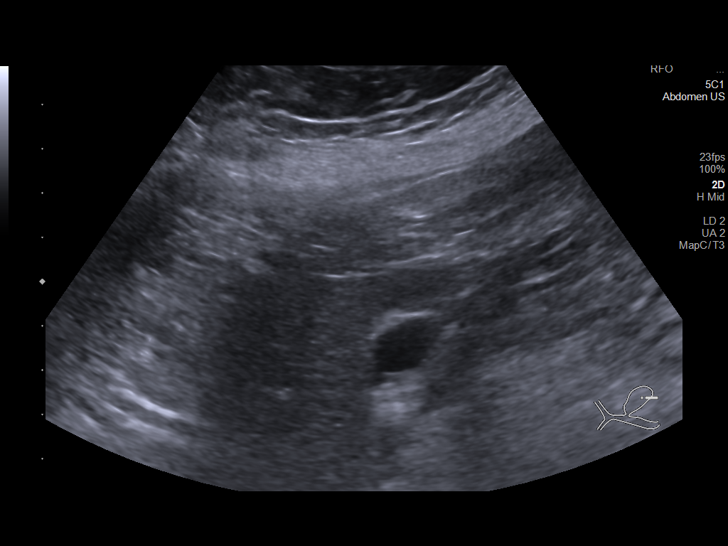
[im 14/56]
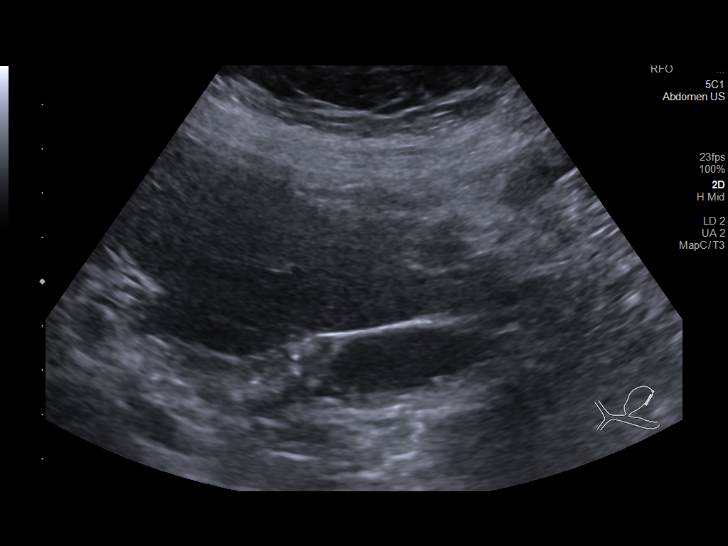
[im 19/56]
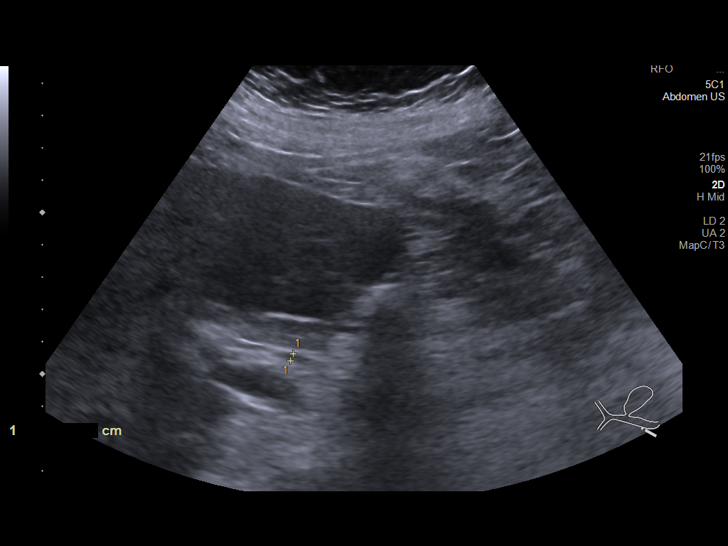
[im 21/56]
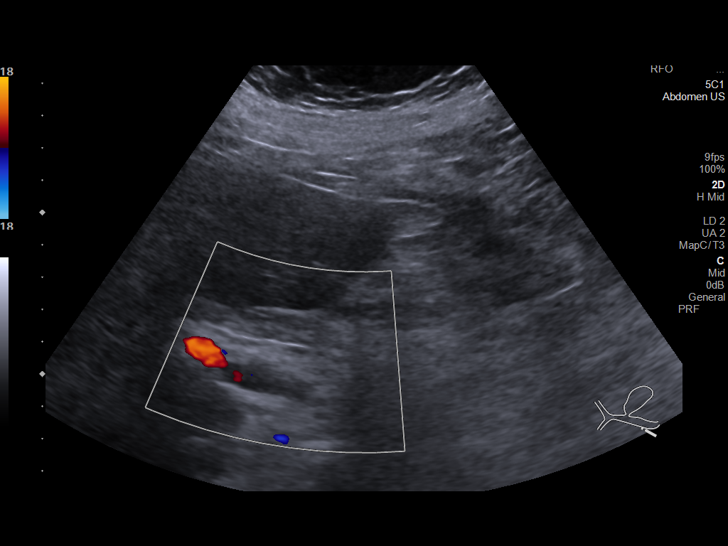
[im 26/56]
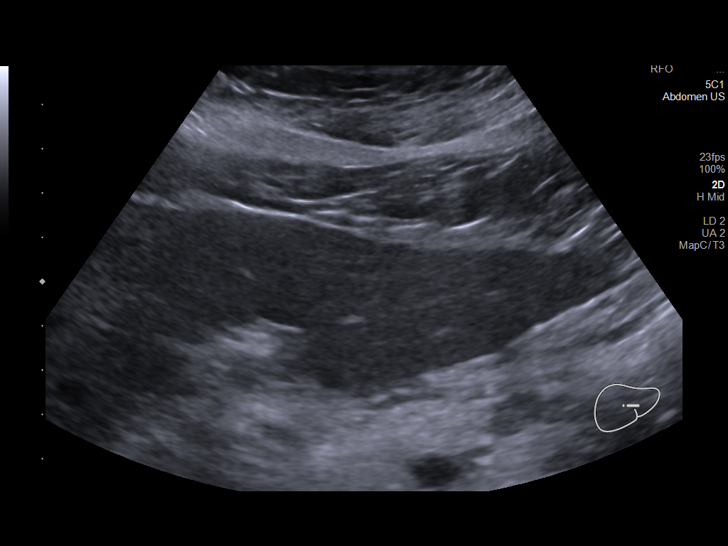
[im 30/56]
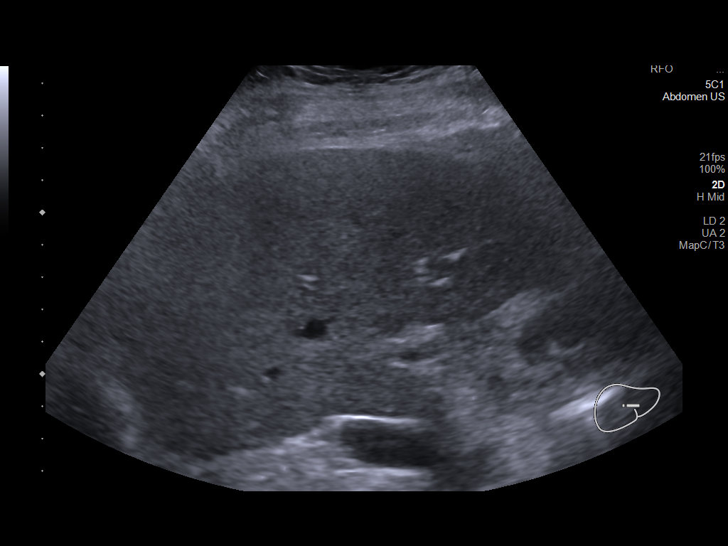
[im 35/56]
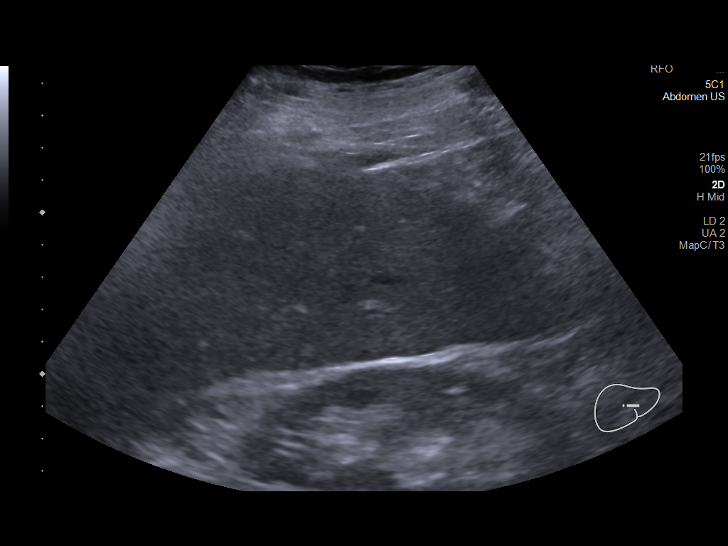
[im 37/56]
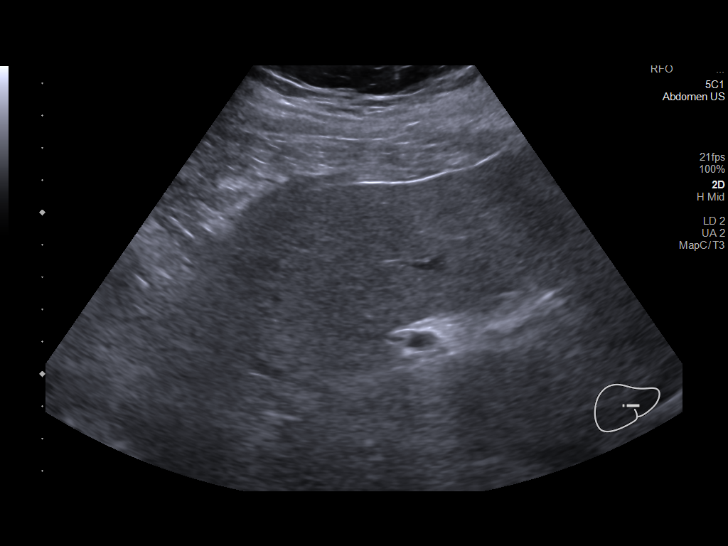
[im 42/56]
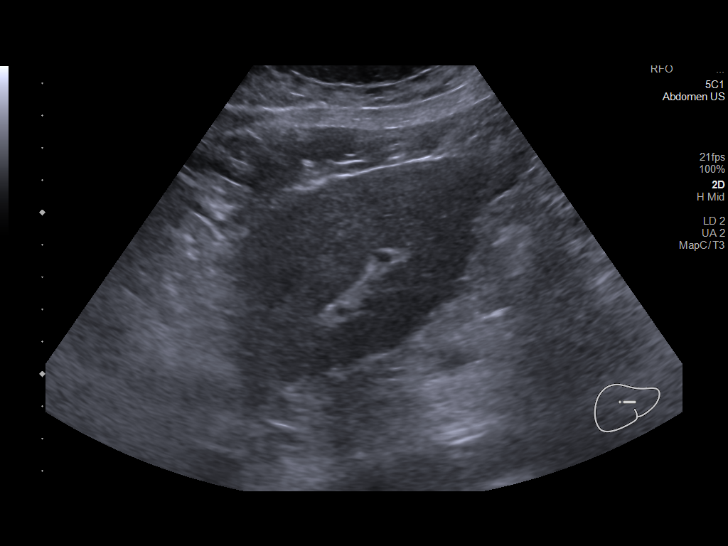
[im 46/56]
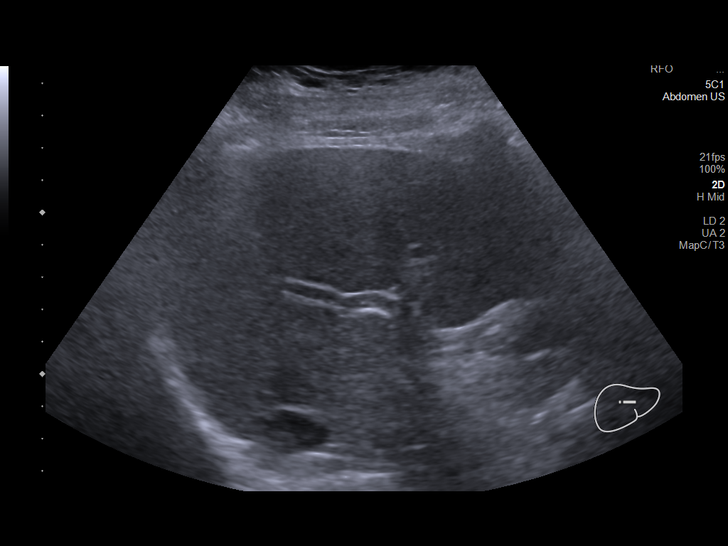
[im 51/56]
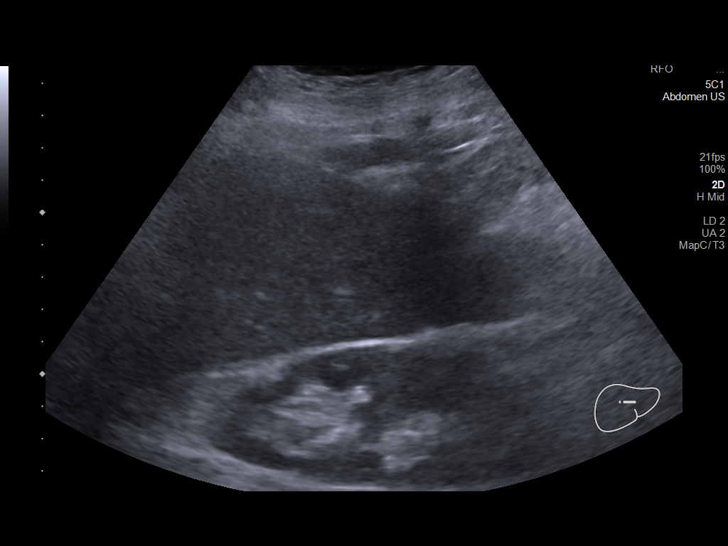
[im 56/56]
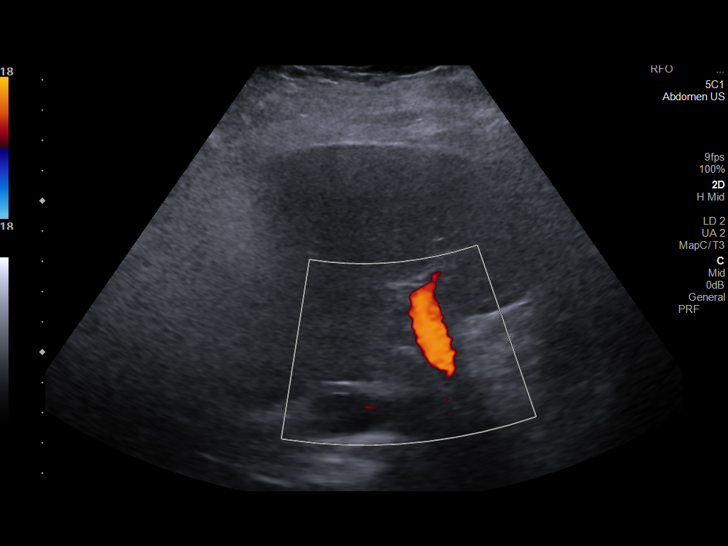

[14 of 25 positions shown; findings below may reference images not displayed]

FINDINGS: Gallbladder:

There is 7 mm hyperechoic structure in the dependent portion of
gallbladder suggesting possible gallbladder stone. There are no
sonographic signs of acute cholecystitis.

Common bile duct:

Diameter: 2.4 mm.

Liver:

There is increased echogenicity in the liver. There is mild
nodularity in the liver surface. No focal abnormality is seen.
Portal vein is patent on color Doppler imaging with normal direction
of blood flow towards the liver.

Other: None.
IMPRESSION: Gallbladder stones. There are no sonographic signs of acute
cholecystitis. There is no dilation of bile ducts.

There is increased echogenicity in the liver with mild nodularity in
the liver surface. No focal abnormality is seen in the liver. No
significant interval changes are noted.

## 2023-01-20 DIAGNOSIS — F17210 Nicotine dependence, cigarettes, uncomplicated: Secondary | ICD-10-CM | POA: Diagnosis not present

## 2023-01-20 DIAGNOSIS — H6121 Impacted cerumen, right ear: Secondary | ICD-10-CM | POA: Diagnosis not present

## 2023-01-20 DIAGNOSIS — J019 Acute sinusitis, unspecified: Secondary | ICD-10-CM | POA: Diagnosis not present

## 2023-02-16 DIAGNOSIS — E039 Hypothyroidism, unspecified: Secondary | ICD-10-CM | POA: Diagnosis not present

## 2023-02-16 DIAGNOSIS — D519 Vitamin B12 deficiency anemia, unspecified: Secondary | ICD-10-CM | POA: Diagnosis not present

## 2023-02-16 DIAGNOSIS — R739 Hyperglycemia, unspecified: Secondary | ICD-10-CM | POA: Diagnosis not present

## 2023-02-16 DIAGNOSIS — I10 Essential (primary) hypertension: Secondary | ICD-10-CM | POA: Diagnosis not present

## 2023-02-16 DIAGNOSIS — E7849 Other hyperlipidemia: Secondary | ICD-10-CM | POA: Diagnosis not present

## 2023-02-16 DIAGNOSIS — D649 Anemia, unspecified: Secondary | ICD-10-CM | POA: Diagnosis not present

## 2023-02-19 DIAGNOSIS — Z23 Encounter for immunization: Secondary | ICD-10-CM | POA: Diagnosis not present

## 2023-02-19 DIAGNOSIS — I10 Essential (primary) hypertension: Secondary | ICD-10-CM | POA: Diagnosis not present

## 2023-02-19 DIAGNOSIS — E7849 Other hyperlipidemia: Secondary | ICD-10-CM | POA: Diagnosis not present

## 2023-02-19 DIAGNOSIS — E039 Hypothyroidism, unspecified: Secondary | ICD-10-CM | POA: Diagnosis not present

## 2023-02-24 DIAGNOSIS — Z1231 Encounter for screening mammogram for malignant neoplasm of breast: Secondary | ICD-10-CM | POA: Diagnosis not present

## 2023-04-06 DIAGNOSIS — M810 Age-related osteoporosis without current pathological fracture: Secondary | ICD-10-CM | POA: Diagnosis not present

## 2023-04-06 DIAGNOSIS — M8589 Other specified disorders of bone density and structure, multiple sites: Secondary | ICD-10-CM | POA: Diagnosis not present

## 2023-05-28 ENCOUNTER — Ambulatory Visit (INDEPENDENT_AMBULATORY_CARE_PROVIDER_SITE_OTHER): Payer: Medicare Other | Admitting: Gastroenterology

## 2023-08-17 DIAGNOSIS — I10 Essential (primary) hypertension: Secondary | ICD-10-CM | POA: Diagnosis not present

## 2023-08-17 DIAGNOSIS — Z131 Encounter for screening for diabetes mellitus: Secondary | ICD-10-CM | POA: Diagnosis not present

## 2023-08-17 DIAGNOSIS — E7849 Other hyperlipidemia: Secondary | ICD-10-CM | POA: Diagnosis not present

## 2023-08-17 DIAGNOSIS — K7469 Other cirrhosis of liver: Secondary | ICD-10-CM | POA: Diagnosis not present

## 2023-08-17 DIAGNOSIS — F17210 Nicotine dependence, cigarettes, uncomplicated: Secondary | ICD-10-CM | POA: Diagnosis not present

## 2023-08-17 DIAGNOSIS — E039 Hypothyroidism, unspecified: Secondary | ICD-10-CM | POA: Diagnosis not present

## 2023-08-20 DIAGNOSIS — R739 Hyperglycemia, unspecified: Secondary | ICD-10-CM | POA: Diagnosis not present

## 2023-08-20 DIAGNOSIS — E7849 Other hyperlipidemia: Secondary | ICD-10-CM | POA: Diagnosis not present

## 2023-08-20 DIAGNOSIS — I10 Essential (primary) hypertension: Secondary | ICD-10-CM | POA: Diagnosis not present

## 2023-08-20 DIAGNOSIS — E039 Hypothyroidism, unspecified: Secondary | ICD-10-CM | POA: Diagnosis not present

## 2023-09-14 ENCOUNTER — Encounter (INDEPENDENT_AMBULATORY_CARE_PROVIDER_SITE_OTHER): Payer: Self-pay | Admitting: Gastroenterology

## 2023-09-14 ENCOUNTER — Ambulatory Visit (INDEPENDENT_AMBULATORY_CARE_PROVIDER_SITE_OTHER): Payer: Medicare Other | Admitting: Gastroenterology

## 2023-09-14 VITALS — BP 161/77 | HR 69 | Temp 97.5°F | Ht 64.5 in | Wt 182.9 lb

## 2023-09-14 DIAGNOSIS — K7460 Unspecified cirrhosis of liver: Secondary | ICD-10-CM

## 2023-09-14 DIAGNOSIS — B1920 Unspecified viral hepatitis C without hepatic coma: Secondary | ICD-10-CM

## 2023-09-14 DIAGNOSIS — K7469 Other cirrhosis of liver: Secondary | ICD-10-CM

## 2023-09-14 NOTE — Patient Instructions (Addendum)
-  We will update labs and [place] in regards to your liver - Reduce salt intake to <2 g per day - Can take Tylenol max of 2 g per day (650 mg q8h) for pain - Avoid NSAIDs for pain - Avoid eating raw oysters/shellfish - Ensure protein shake every night before going to sleep   Follow up 6 months  It was a pleasure to see you today. I want to create trusting relationships with patients and provide genuine, compassionate, and quality care. I truly value your feedback! please be on the lookout for a survey regarding your visit with me today. I appreciate your input about our visit and your time in completing this!    Jonte Wollam L. Jeanmarie Hubert, MSN, APRN, AGNP-C Adult-Gerontology Nurse Practitioner [facility] Gastroenterology at [facility]

## 2023-09-14 NOTE — Progress Notes (Signed)
Referring Provider: Selinda Flavin, MD Primary Care Physician:  Selinda Flavin, MD Primary GI Physician: Dr. Levon Hedger  Chief Complaint  Patient presents with   Follow-up    Patient here today for a yearly follow up.    HPI:   [name] is a 72 y.o. female with past medical history of hepatitis C 1b s/p 3 treatments with SVR after taking Harvoni in 2016 complicated by liver cirrhosis, GERD, hypertension   Patient presenting today for follow up of liver cirrhosis secondary to Hepatitis C  Last seen June 2023, at that time having some RUQ pain at night.   Recommended to check CBC, MELD labs, AFP, schedule [place] of liver  Present: States doing well, recently  had to go on thyroid medication. She denies any swelling in her abdomen, jaundice or pruritus. She has no GI complaints. No red flag symptoms. Patient denies melena, hematochezia, nausea, vomiting, diarrhea, constipation, dysphagia, odyonophagia, early satiety or weight loss.    Cirrhosis related questions: Hematemesis/coffee ground emesis: No History of variceal bleeding: No Abdominal pain: no  Abdominal distention/worsening ascitesNo Fever/chills: No Episodes of confusion/disorientation: No Taking diuretics?: No Prior history of banding?: No Prior episodes of SBP: No Last time liver imaging was performed: 06/12/22 Increased hepatic parenchymal echogenicity suggestive of steatosis.Cholelithiasis without secondary signs of acute cholecystitis.   MELD score: 13 June 2022   Last EGD: multiple years ago, performed by Dr. Karilyn Cota normal per patient but no reports available Last Colonoscopy:2009  Had negative Cologuard on 12/26/2021   Past Medical History:  Diagnosis Date   Anemia    Blood transfusion without reported diagnosis    GERD (gastroesophageal reflux disease)    Hepatitis C   Hypertension     Past Surgical History:  Procedure Laterality Date   CESAREAN SECTION  06/30/86   COLONOSCOPY  09/25/06   ROURK    LIVER BIOPSY  03/19/07   TONSILLECTOMY  1960   UPPER GASTROINTESTINAL ENDOSCOPY  07/20/2009   UPPER GASTROINTESTINAL ENDOSCOPY  09/25/06   ROURK    Current Outpatient Medications  Medication Sig Dispense Refill   amLODipine (NORVASC) 5 MG tablet Take 10 mg by mouth daily.      calcium citrate-vitamin D (CITRACAL+D) 315-200 MG-UNIT tablet Take by mouth 2 (two) times daily.       levothyroxine (SYNTHROID) 50 MCG tablet Take 50 mcg by mouth daily.     metoprolol succinate (TOPROL-XL) 100 MG 24 hr tablet Take 100 mg by mouth daily.      OVER THE COUNTER MEDICATION Vit C 1,000mg   one daily     zinc gluconate 50 MG tablet Take 50 mg by mouth daily.     No current facility-administered medications for this visit.   Facility-Administered Medications Ordered in Other Visits  Medication Dose Route Frequency Provider Last Rate Last Admin   0.9 %  sodium chloride infusion   Intravenous Continuous Rehman, Joline Maxcy, MD        Allergies as of 09/14/2023   (No Known Allergies)    Family History  Problem Relation Age of Onset   Heart disease Mother    Heart disease Father    Cancer Father        lung   Healthy Sister    Healthy Daughter    Healthy Daughter    Healthy Son     Social History   Socioeconomic History   Marital status: Widowed    Spouse name: Not on file   Number of children: Not  on file   Years of education: Not on file   Highest education level: Not on file  Occupational History   Not on file  Tobacco Use   Smoking status: Former    Current packs/day: 0.00    Types: Cigarettes    Quit date: 10/08/2012    Years since quitting: 10.9    Passive exposure: Past   Smokeless tobacco: Never  Vaping Use   Vaping status: Never Used  Substance and Sexual Activity   Alcohol use: No    Alcohol/week: 0.0 standard drinks of alcohol   Drug use: No   Sexual activity: Not on file  Other Topics Concern   Not on file  Social History Narrative   Not on file   Social  Determinants of Health   Financial Resource Strain: Not on file  Food Insecurity: Not on file  Transportation Needs: Not on file  Physical Activity: Not on file  Stress: Not on file  Social Connections: Not on file    Review of systems General: negative for malaise, night sweats, fever, chills, weight loss Neck: Negative for lumps, goiter, pain and significant neck swelling Resp: Negative for cough, wheezing, dyspnea at rest CV: Negative for chest pain, leg swelling, palpitations, orthopnea GI: denies melena, hematochezia, nausea, vomiting, diarrhea, constipation, dysphagia, odyonophagia, early satiety or unintentional weight loss.  MSK: Negative for joint pain or swelling, back pain, and muscle pain. Derm: Negative for itching or rash Psych: Denies depression, anxiety, memory loss, confusion. No homicidal or suicidal ideation.  Heme: Negative for prolonged bleeding, bruising easily, and swollen nodes. Endocrine: Negative for cold or heat intolerance, polyuria, polydipsia and goiter. Neuro: negative for tremor, gait imbalance, syncope and seizures. The remainder of the review of systems is noncontributory.  Physical Exam: BP (!) 161/77 (BP Location: Left Arm, Patient Position: Sitting, Cuff Size: Large)   Pulse 69   Temp (!) 97.5 F (36.4 C) (Temporal)   Ht 5' 4.5" (1.638 m)   Wt 182 lb 14.4 oz (83 kg)   BMI 30.91 kg/m  General:   Alert and oriented. No distress noted. Pleasant and cooperative.  Head:  Normocephalic and atraumatic. Eyes:  Conjuctiva clear without scleral icterus. Mouth:  Oral mucosa pink and moist. Good dentition. No lesions. Heart: Normal rate and rhythm, s1 and s2 heart sounds present.  Lungs: Clear lung sounds in all lobes. Respirations equal and unlabored. Abdomen:  +BS, soft, non-tender and non-distended. No rebound or guarding. No HSM or masses noted. Derm: No palmar erythema or jaundice Msk:  Symmetrical without gross deformities. Normal  posture. Extremities:  Without edema. Neurologic:  Alert and  oriented x4 Psych:  Alert and cooperative. Normal mood and affect.  Invalid input(s): "6 MONTHS"   ASSESSMENT: [name] is a 72 y.o. female presenting today for follow up of hepatic cirrhosis secondary to hepatitis C.  Last MELD 7, though she was lost to follow up after her last visit in June 2023. She is doing well today. She has no GI complaints. Has remained well compensated. Last plt count >150k therefore, no EGDs for EVs in the past. We will update CBC, AFP, INR and CMP today to calculate MELD score as well as RUQ [place] for HCC screening, I discussed the importance of routine labs and [place] every 6 months with the patient.    PLAN:  -CBC, CMP, AFP, INR  -. Liver US  - Reduce salt intake to <2 g per day - Can take Tylenol max of 2  g per day (650 mg q8h) for pain - Avoid NSAIDs for pain - Avoid eating raw oysters/shellfish - Ensure protein shake every night before going to sleep  All questions were answered, patient verbalized understanding and is in agreement with plan as outlined above.   Follow Up: 6 months   Payal Stanforth L. Jeanmarie Hubert, MSN, APRN, AGNP-C Adult-Gerontology Nurse Practitioner [facility]   I have reviewed the note and agree with the APP's assessment as described in this progress note  Katrinka Blazing, MD Gastroenterology and Hepatology [facility] Gastroenterology

## 2023-09-16 LAB — COMPREHENSIVE METABOLIC PANEL
AG Ratio: 1.4 (calc) (ref 1.0–2.5)
ALT: 12 U/L (ref 6–29)
AST: 15 U/L (ref 10–35)
Albumin: 4.3 g/dL (ref 3.6–5.1)
Alkaline phosphatase (APISO): 70 U/L (ref 37–153)
BUN: 12 mg/dL (ref 7–25)
CO2: 25 mmol/L (ref 20–32)
Calcium: 9.4 mg/dL (ref 8.6–10.4)
Chloride: 104 mmol/L (ref 98–110)
Creat: 0.79 mg/dL (ref 0.50–1.05)
Globulin: 3.1 g/dL (ref 1.9–3.7)
Glucose, Bld: 106 mg/dL — ABNORMAL HIGH (ref 65–99)
Potassium: 4.5 mmol/L (ref 3.5–5.3)
Sodium: 140 mmol/L (ref 135–146)
Total Bilirubin: 0.7 mg/dL (ref 0.2–1.2)
Total Protein: 7.4 g/dL (ref 6.1–8.1)

## 2023-09-16 LAB — CBC
HCT: 42.1 % (ref 35.0–45.0)
Hemoglobin: 12.5 g/dL (ref 11.7–15.5)
MCH: 19.7 pg — ABNORMAL LOW (ref 27.0–33.0)
MCHC: 29.7 g/dL — ABNORMAL LOW (ref 32.0–36.0)
MCV: 66.3 fL — ABNORMAL LOW (ref 80.0–100.0)
MPV: 10.9 fL (ref 7.5–12.5)
Platelets: 267 10*3/uL (ref 140–400)
RBC: 6.35 10*6/uL — ABNORMAL HIGH (ref 3.80–5.10)
RDW: 17.2 % — ABNORMAL HIGH (ref 11.0–15.0)
WBC: 9.4 10*3/uL (ref 3.8–10.8)

## 2023-09-16 LAB — AFP TUMOR MARKER: AFP-Tumor Marker: 3.2 ng/mL

## 2023-09-16 LAB — PROTIME-INR
INR: 1
Prothrombin Time: 10.5 s (ref 9.0–11.5)

## 2023-09-22 ENCOUNTER — Ambulatory Visit (HOSPITAL_COMMUNITY)
Admission: RE | Admit: 2023-09-22 | Discharge: 2023-09-22 | Disposition: A | Discharge status: Home / Self Care | Payer: Medicare Other | Source: Ambulatory Visit | Attending: Gastroenterology | Admitting: Gastroenterology

## 2023-09-22 DIAGNOSIS — K7469 Other cirrhosis of liver: Secondary | ICD-10-CM | POA: Insufficient documentation

## 2023-09-22 DIAGNOSIS — K8020 Calculus of gallbladder without cholecystitis without obstruction: Secondary | ICD-10-CM | POA: Diagnosis not present

## 2023-10-19 DIAGNOSIS — R051 Acute cough: Secondary | ICD-10-CM | POA: Diagnosis not present

## 2023-10-19 DIAGNOSIS — B349 Viral infection, unspecified: Secondary | ICD-10-CM | POA: Diagnosis not present

## 2023-10-19 DIAGNOSIS — J209 Acute bronchitis, unspecified: Secondary | ICD-10-CM | POA: Diagnosis not present

## 2023-10-19 DIAGNOSIS — U071 COVID-19: Secondary | ICD-10-CM | POA: Diagnosis not present

## 2023-10-19 DIAGNOSIS — J9801 Acute bronchospasm: Secondary | ICD-10-CM | POA: Diagnosis not present

## 2023-10-19 DIAGNOSIS — L539 Erythematous condition, unspecified: Secondary | ICD-10-CM | POA: Diagnosis not present

## 2024-01-15 DIAGNOSIS — J069 Acute upper respiratory infection, unspecified: Secondary | ICD-10-CM | POA: Diagnosis not present

## 2024-02-22 DIAGNOSIS — F17210 Nicotine dependence, cigarettes, uncomplicated: Secondary | ICD-10-CM | POA: Diagnosis not present

## 2024-02-22 DIAGNOSIS — E039 Hypothyroidism, unspecified: Secondary | ICD-10-CM | POA: Diagnosis not present

## 2024-02-22 DIAGNOSIS — R739 Hyperglycemia, unspecified: Secondary | ICD-10-CM | POA: Diagnosis not present

## 2024-02-22 DIAGNOSIS — K7469 Other cirrhosis of liver: Secondary | ICD-10-CM | POA: Diagnosis not present

## 2024-02-22 DIAGNOSIS — Z13220 Encounter for screening for lipoid disorders: Secondary | ICD-10-CM | POA: Diagnosis not present

## 2024-03-07 DIAGNOSIS — F17210 Nicotine dependence, cigarettes, uncomplicated: Secondary | ICD-10-CM | POA: Diagnosis not present

## 2024-03-07 DIAGNOSIS — R739 Hyperglycemia, unspecified: Secondary | ICD-10-CM | POA: Diagnosis not present

## 2024-03-07 DIAGNOSIS — Z0000 Encounter for general adult medical examination without abnormal findings: Secondary | ICD-10-CM | POA: Diagnosis not present

## 2024-03-07 DIAGNOSIS — E039 Hypothyroidism, unspecified: Secondary | ICD-10-CM | POA: Diagnosis not present

## 2024-03-14 ENCOUNTER — Encounter (INDEPENDENT_AMBULATORY_CARE_PROVIDER_SITE_OTHER): Payer: Self-pay | Admitting: Gastroenterology

## 2024-03-14 ENCOUNTER — Ambulatory Visit (INDEPENDENT_AMBULATORY_CARE_PROVIDER_SITE_OTHER): Payer: Medicare Other | Admitting: Gastroenterology

## 2024-03-14 VITALS — BP 150/73 | HR 76 | Temp 97.8°F | Ht 64.5 in | Wt 178.7 lb

## 2024-03-14 DIAGNOSIS — K7460 Unspecified cirrhosis of liver: Secondary | ICD-10-CM

## 2024-03-14 DIAGNOSIS — Z8619 Personal history of other infectious and parasitic diseases: Secondary | ICD-10-CM

## 2024-03-14 DIAGNOSIS — K7469 Other cirrhosis of liver: Secondary | ICD-10-CM | POA: Diagnosis not present

## 2024-03-14 DIAGNOSIS — R197 Diarrhea, unspecified: Secondary | ICD-10-CM | POA: Insufficient documentation

## 2024-03-14 NOTE — Patient Instructions (Signed)
-  we will update labs and [place] for your cirrhosis  -will check stool studies to look for causes of diarrhea  -Reduce salt intake to <2 g per day - Can take Tylenol max of 2 g per day (650 mg q8h) for pain - Avoid NSAIDs for pain - Avoid eating raw oysters/shellfish - Ensure every night before going to sleep  Follow up 6 months  It was a pleasure to see you today. I want to create trusting relationships with patients and provide genuine, compassionate, and quality care. I truly value your feedback! please be on the lookout for a survey regarding your visit with me today. I appreciate your input about our visit and your time in completing this!    Shatora Weatherbee L. Jeanmarie Hubert, MSN, APRN, AGNP-C Adult-Gerontology Nurse Practitioner [facility] Gastroenterology at [facility]

## 2024-03-14 NOTE — Progress Notes (Addendum)
 Referring Provider: Selinda Flavin, MD Primary Care Physician:  Selinda Flavin, MD Primary GI Physician: Dr. Levon Hedger   Chief Complaint  Patient presents with   Cirrhosis    Follow up on cirrhosis. States doing well and no concerns.    HPI:   [name] is a 73 y.o. female with past medical history of  hepatitis C 1b s/p 3 treatments with SVR after taking Harvoni in 2016 complicated by liver cirrhosis, GERD, hypertension    Patient presenting today for:  Follow up of Cirrhosis secondary to Hep C New onset diarrhea  Last seen October 2024, at that time,.  Denies any swelling in her abdomen, jaundice, pruritus.  She had no GI symptoms.  Patient was recommended to have CBC, CMP, AFP, INR, update liver ultrasound for HCC screening  Labs in 09/14/2023 with platelet count 267, T. bili 0.7, alk phos 70, AST 15, ALT 12, AFP tumor marker 3.2, INR 1   Present: Doing well today. Denies swelling to there abdomen or legs. No episodes of confusion, jaundice or pruritus.  Denies rectal bleeding or melena.She is having some looser to watery stools for the past month or so. No abdominal pain, nausea or vomiting. Denies any antibiotic use or sick contacst. Was on methylprednisolone in February for sinus infection but no other new medications. Thyroid med was increased about 6 months ago  Patient brings labs with her from PCP which showed platelet count 218k, sodium 140, potassium 4.5 AST 11, alk phos 58, ALT 6, T bili 1.3, creat 0.94, hgb 11.3(normal on lab scale), TSH 3.5   Cirrhosis related questions: Hematemesis/coffee ground emesis: No History of variceal bleeding: No Abdominal pain: no  Abdominal distention/worsening ascitesNo Fever/chills: No Episodes of confusion/disorientation: No Taking diuretics?: No Prior history of banding?: No Prior episodes of SBP: No Last time liver imaging was performed: 09/2023  Cholelithiasis without secondary signs of acute cholecystitis. Increased  hepatic parenchymal echogenicity suggestive of steatosis. MELD score: 09/2023 7   Last EGD: multiple years ago, performed by Dr. Karilyn Cota normal per patient but no reports available Last Colonoscopy:2009  Had negative Cologuard on 12/26/2021  Past Medical History:  Diagnosis Date   Anemia    Blood transfusion without reported diagnosis    GERD (gastroesophageal reflux disease)    Hepatitis C   Hypertension     Past Surgical History:  Procedure Laterality Date   CESAREAN SECTION  06/30/86   COLONOSCOPY  09/25/06   ROURK   LIVER BIOPSY  03/19/07   TONSILLECTOMY  1960   UPPER GASTROINTESTINAL ENDOSCOPY  07/20/2009   UPPER GASTROINTESTINAL ENDOSCOPY  09/25/06   ROURK    Current Outpatient Medications  Medication Sig Dispense Refill   amLODipine (NORVASC) 5 MG tablet Take 5 mg by mouth daily.     calcium citrate-vitamin D (CITRACAL+D) 315-200 MG-UNIT tablet Take by mouth 2 (two) times daily.       levothyroxine (SYNTHROID) 50 MCG tablet Take 50 mcg by mouth daily.     metoprolol succinate (TOPROL-XL) 100 MG 24 hr tablet Take 100 mg by mouth daily.      OVER THE COUNTER MEDICATION Vit C 1,000mg   one daily     zinc gluconate 50 MG tablet Take 50 mg by mouth daily.     No current facility-administered medications for this visit.   Facility-Administered Medications Ordered in Other Visits  Medication Dose Route Frequency Provider Last Rate Last Admin   0.9 %  sodium chloride infusion   Intravenous Continuous Rehman,  Joline Maxcy, MD        Allergies as of 03/14/2024   (No Known Allergies)    Social History   Socioeconomic History   Marital status: Widowed    Spouse name: Not on file   Number of children: Not on file   Years of education: Not on file   Highest education level: Not on file  Occupational History   Not on file  Tobacco Use   Smoking status: Former    Current packs/day: 0.00    Types: Cigarettes    Quit date: 10/08/2012    Years since quitting: 11.4     Passive exposure: Past   Smokeless tobacco: Never  Vaping Use   Vaping status: Never Used  Substance and Sexual Activity   Alcohol use: No    Alcohol/week: 0.0 standard drinks of alcohol   Drug use: No   Sexual activity: Not on file  Other Topics Concern   Not on file  Social History Narrative   Not on file   Social Drivers of Health   Financial Resource Strain: Not on file  Food Insecurity: Not on file  Transportation Needs: Not on file  Physical Activity: Not on file  Stress: Not on file  Social Connections: Not on file    Review of systems General: negative for malaise, night sweats, fever, chills, weight loss Neck: Negative for lumps, goiter, pain and significant neck swelling Resp: Negative for cough, wheezing, dyspnea at rest CV: Negative for chest pain, leg swelling, palpitations, orthopnea GI: denies melena, hematochezia, nausea, vomiting, constipation, dysphagia, odyonophagia, early satiety or unintentional weight loss. +diarrhea MSK: Negative for joint pain or swelling, back pain, and muscle pain. Derm: Negative for itching or rash Psych: Denies depression, anxiety, memory loss, confusion. No homicidal or suicidal ideation.  Heme: Negative for prolonged bleeding, bruising easily, and swollen nodes. Endocrine: Negative for cold or heat intolerance, polyuria, polydipsia and goiter. Neuro: negative for tremor, gait imbalance, syncope and seizures. The remainder of the review of systems is noncontributory.  Physical Exam: There were no vitals taken for this visit. General:   Alert and oriented. No distress noted. Pleasant and cooperative.  Head:  Normocephalic and atraumatic. Eyes:  Conjuctiva clear without scleral icterus. Mouth:  Oral mucosa pink and moist. Good dentition. No lesions. Heart: Normal rate and rhythm, s1 and s2 heart sounds present.  Lungs: Clear lung sounds in all lobes. Respirations equal and unlabored. Abdomen:  +BS, soft, non-tender and  non-distended. No rebound or guarding. No HSM or masses noted. Derm: No palmar erythema or jaundice Msk:  Symmetrical without gross deformities. Normal posture. Extremities:  Without edema. Neurologic:  Alert and  oriented x4 Psych:  Alert and cooperative. Normal mood and affect.  Invalid input(s): "6 MONTHS"   ASSESSMENT: [name] is a 73 y.o. female presenting today for follow up of Cirrhosis and with new onset diarrhea   Patient with well compensated cirrhosis secondary to Hepatitis C. She had recent CMP and CBC done at PCP which showed normal LFTs and Platelet count >150k, therefore no indication for EGD for esophageal variceal screening at this time.  She is due for INR and AFP as well as ultrasound for [facility] screening which we will get ordered.  Patient also reporting some new onset diarrhea over the past month or so, with loose watery stools.  She denies any medication changes, was on steroids at February they have no other new medications.  No rectal bleeding, melena, abdominal pain, weight loss.  Denies any recent sick contacts or changes in her diet.  Denies any history of constipation.  Will check stool studies to rule out infectious cause of her diarrhea though if diarrhea persist, may need to consider a colonoscopy as last was in 2009 (patient has declined further screening colonoscopies as she had Cologuard in 2023).  PLAN:  -[place] for HCC screening -INR and AFP  -check stool studies -Consider updating colonoscopy if diarrhea persist -Reduce salt intake to <2 g per day - Can take Tylenol max of 2 g per day (650 mg q8h) for pain - Avoid NSAIDs for pain - Avoid eating raw oysters/shellfish - Ensure every night before going to sleep  All questions were answered, patient verbalized understanding and is in agreement with plan as outlined above.   Follow Up: 6 months   Johnny Latu L. Jeanmarie Hubert, MSN, APRN, AGNP-C Adult-Gerontology Nurse Practitioner [facility] GI  Diseases  I have reviewed the note and agree with the APP's assessment as described in this progress note  Katrinka Blazing, MD Gastroenterology and Hepatology [facility]

## 2024-03-15 LAB — PROTIME-INR
INR: 1
Prothrombin Time: 10.6 s (ref 9.0–11.5)

## 2024-03-15 LAB — AFP TUMOR MARKER: AFP-Tumor Marker: 3.8 ng/mL

## 2024-03-17 ENCOUNTER — Encounter (INDEPENDENT_AMBULATORY_CARE_PROVIDER_SITE_OTHER): Payer: Self-pay

## 2024-03-18 ENCOUNTER — Ambulatory Visit (HOSPITAL_COMMUNITY)
Admission: RE | Admit: 2024-03-18 | Discharge: 2024-03-18 | Disposition: A | Discharge status: Home / Self Care | Source: Ambulatory Visit | Attending: Gastroenterology | Admitting: Gastroenterology

## 2024-03-18 DIAGNOSIS — K7460 Unspecified cirrhosis of liver: Secondary | ICD-10-CM | POA: Diagnosis not present

## 2024-03-18 DIAGNOSIS — K7469 Other cirrhosis of liver: Secondary | ICD-10-CM | POA: Diagnosis not present

## 2024-03-18 DIAGNOSIS — K8020 Calculus of gallbladder without cholecystitis without obstruction: Secondary | ICD-10-CM | POA: Diagnosis not present

## 2024-03-22 ENCOUNTER — Encounter (INDEPENDENT_AMBULATORY_CARE_PROVIDER_SITE_OTHER): Payer: Self-pay

## 2024-09-01 DIAGNOSIS — R739 Hyperglycemia, unspecified: Secondary | ICD-10-CM | POA: Diagnosis not present

## 2024-09-01 DIAGNOSIS — K7469 Other cirrhosis of liver: Secondary | ICD-10-CM | POA: Diagnosis not present

## 2024-09-01 DIAGNOSIS — D649 Anemia, unspecified: Secondary | ICD-10-CM | POA: Diagnosis not present

## 2024-09-01 DIAGNOSIS — Z131 Encounter for screening for diabetes mellitus: Secondary | ICD-10-CM | POA: Diagnosis not present

## 2024-09-01 DIAGNOSIS — E7849 Other hyperlipidemia: Secondary | ICD-10-CM | POA: Diagnosis not present

## 2024-09-01 DIAGNOSIS — E038 Other specified hypothyroidism: Secondary | ICD-10-CM | POA: Diagnosis not present

## 2024-09-08 DIAGNOSIS — F17210 Nicotine dependence, cigarettes, uncomplicated: Secondary | ICD-10-CM | POA: Diagnosis not present

## 2024-09-08 DIAGNOSIS — R739 Hyperglycemia, unspecified: Secondary | ICD-10-CM | POA: Diagnosis not present

## 2024-09-08 DIAGNOSIS — Z23 Encounter for immunization: Secondary | ICD-10-CM | POA: Diagnosis not present

## 2024-09-08 DIAGNOSIS — E039 Hypothyroidism, unspecified: Secondary | ICD-10-CM | POA: Diagnosis not present

## 2024-09-08 DIAGNOSIS — Z862 Personal history of diseases of the blood and blood-forming organs and certain disorders involving the immune mechanism: Secondary | ICD-10-CM | POA: Diagnosis not present

## 2024-09-12 ENCOUNTER — Ambulatory Visit (INDEPENDENT_AMBULATORY_CARE_PROVIDER_SITE_OTHER): Admitting: Gastroenterology

## 2024-09-12 ENCOUNTER — Encounter (INDEPENDENT_AMBULATORY_CARE_PROVIDER_SITE_OTHER): Payer: Self-pay | Admitting: *Deleted

## 2024-09-12 VITALS — BP 168/65 | HR 72 | Temp 97.1°F | Ht 64.0 in | Wt 173.5 lb

## 2024-09-12 DIAGNOSIS — K7469 Other cirrhosis of liver: Secondary | ICD-10-CM

## 2024-09-12 DIAGNOSIS — B1920 Unspecified viral hepatitis C without hepatic coma: Secondary | ICD-10-CM

## 2024-09-12 DIAGNOSIS — R195 Other fecal abnormalities: Secondary | ICD-10-CM | POA: Insufficient documentation

## 2024-09-12 NOTE — Progress Notes (Addendum)
 Referring Provider: Toribio Jerel MATSU, MD Primary Care Physician:  Toribio Jerel MATSU, MD Primary GI Physician: Dr. Eartha   Chief Complaint  Patient presents with   Follow-up   HPI:   [name] is a 73 y.o. female with past medical history of  hepatitis C 1b s/p 3 treatments with SVR after taking Harvoni in 2016 complicated by liver cirrhosis, GERD, hypertension   Patient presenting today for:  Follow up of cirrhosis secondary to Hep C  Last seen April, at that time doing well, having looser to watery stools. Recently on steroids for sinus infection, thyroid  meds increased about 6 months prior.   Recommend US  for HCC screening, inr, afp, check stool studies, consider updating colonscopy if diarrhea persists  Stool studies were not completed  INR 1 and AFP 3.8 on 03/14/24  Present:  Watery stools have stopped. She states she did not do stool testing as she was out of town for about 1 month after she saw me last. No abdominal pain, rectal bleeding or melena. Having a BM daily on average though some days she can have up to 3-4 stools per day, looser in nature. She will occasionally take imodium if she is going out and having looser stools. Weight is stable, appetite is good.   She denies any swelling to her abdomen or legs. No episodes of confusion, forgetfulness.   She brings labs with her today, performed on 9/25 with plat count 272k, sodium 140, potassiumg 4.3 AST 20, alk phos 82 ALt 11, t bili 1.1 crea 0.89. hgb 12.5, TSH 3.589  Cirrhosis related questions: Hematemesis/coffee ground emesis: No History of variceal bleeding: No Abdominal pain: no  Abdominal distention/worsening ascitesNo Fever/chills: No Episodes of confusion/disorientation: No Taking diuretics?: No Prior history of banding?: No Prior episodes of SBP: No Last time liver imaging was performed: 03/18/24  Cholelithiasis without sonographic evidence of acute cholecystitis. 2. Increased echotexture of the  liver. This is nonspecific but can be seen in fatty infiltration of liver. MELD score: 09/2023, MELD 7   Last EGD: multiple years ago, performed by Dr. Golda normal per patient but no reports available Last Colonoscopy:2009  Had negative Cologuard on 12/26/2021  Filed Weights   09/12/24 1012  Weight: 173 lb 8 oz (78.7 kg)     Past Medical History:  Diagnosis Date   Anemia    Blood transfusion without reported diagnosis    GERD (gastroesophageal reflux disease)    Hepatitis C   Hypertension     Past Surgical History:  Procedure Laterality Date   CESAREAN SECTION  06/30/86   COLONOSCOPY  09/25/06   ROURK   LIVER BIOPSY  03/19/07   TONSILLECTOMY  1960   UPPER GASTROINTESTINAL ENDOSCOPY  07/20/2009   UPPER GASTROINTESTINAL ENDOSCOPY  09/25/06   ROURK    Current Outpatient Medications  Medication Sig Dispense Refill   amLODipine (NORVASC) 5 MG tablet Take 5 mg by mouth daily.     calcium citrate-vitamin D (CITRACAL+D) 315-200 MG-UNIT tablet Take by mouth 2 (two) times daily.       levothyroxine (SYNTHROID) 50 MCG tablet Take 50 mcg by mouth daily.     metoprolol succinate (TOPROL-XL) 100 MG 24 hr tablet Take 100 mg by mouth daily.      zinc gluconate 50 MG tablet Take 50 mg by mouth daily.     OVER THE COUNTER MEDICATION Vit C 1,000mg   one daily (Patient not taking: Reported on 09/12/2024)     No current facility-administered  medications for this visit.   Facility-Administered Medications Ordered in Other Visits  Medication Dose Route Frequency Provider Last Rate Last Admin   0.9 %  sodium chloride  infusion   Intravenous Continuous Rehman, Claudis PENNER, MD        Allergies as of 09/12/2024   (No Known Allergies)    Social History   Socioeconomic History   Marital status: Widowed    Spouse name: Not on file   Number of children: Not on file   Years of education: Not on file   Highest education level: Not on file  Occupational History   Not on file  Tobacco Use    Smoking status: Former    Current packs/day: 0.00    Types: Cigarettes    Quit date: 10/08/2012    Years since quitting: 11.9    Passive exposure: Past   Smokeless tobacco: Never  Vaping Use   Vaping status: Never Used  Substance and Sexual Activity   Alcohol use: No    Alcohol/week: 0.0 standard drinks of alcohol   Drug use: No   Sexual activity: Not on file  Other Topics Concern   Not on file  Social History Narrative   Not on file   Social Drivers of Health   Financial Resource Strain: Not on file  Food Insecurity: Not on file  Transportation Needs: Not on file  Physical Activity: Not on file  Stress: Not on file  Social Connections: Not on file    Review of systems General: negative for malaise, night sweats, fever, chills, weight loss Neck: Negative for lumps, goiter, pain and significant neck swelling Resp: Negative for cough, wheezing, dyspnea at rest CV: Negative for chest pain, leg swelling, palpitations, orthopnea GI: denies melena, hematochezia, nausea, vomiting, constipation, dysphagia, odyonophagia, early satiety or unintentional weight loss. +intermittent loose stools MSK: Negative for joint pain or swelling, back pain, and muscle pain. Derm: Negative for itching or rash Psych: Denies depression, anxiety, memory loss, confusion. No homicidal or suicidal ideation.  Heme: Negative for prolonged bleeding, bruising easily, and swollen nodes. Endocrine: Negative for cold or heat intolerance, polyuria, polydipsia and goiter. Neuro: negative for tremor, gait imbalance, syncope and seizures. The remainder of the review of systems is noncontributory.  Physical Exam: BP (!) 168/65 (BP Location: Left Arm, Patient Position: Sitting, Cuff Size: Normal)   Pulse 72   Temp (!) 97.1 F (36.2 C) (Temporal)   Ht 5' 4 (1.626 m)   Wt 173 lb 8 oz (78.7 kg)   BMI 29.78 kg/m  General:   Alert and oriented. No distress noted. Pleasant and cooperative.  Head:  Normocephalic  and atraumatic. Eyes:  Conjuctiva clear without scleral icterus. Mouth:  Oral mucosa pink and moist. Good dentition. No lesions. Heart: Normal rate and rhythm, s1 and s2 heart sounds present.  Lungs: Clear lung sounds in all lobes. Respirations equal and unlabored. Abdomen:  +BS, soft, non-tender and non-distended. No rebound or guarding. No HSM or masses noted. Derm: No palmar erythema or jaundice Msk:  Symmetrical without gross deformities. Normal posture. Extremities:  Without edema. Neurologic:  Alert and  oriented x4 Psych:  Alert and cooperative. Normal mood and affect.  Invalid input(s): 6 MONTHS   ASSESSMENT: [name] is a 73 y.o. female presenting today for follow up of cirrhosis and looser stools  Cirrhosis: secondary to Hepatitis C. Has remained well compensated. She had recent CMP and CBC done at PCP which showed normal LFTs and Platelet count >150k, therefore no  indication for EGD for esophageal variceal screening at this time.  She is due for INR and AFP as well as ultrasound for [facility] screening which we will get ordered.   Looser stools: intermittent now with no watery stools. Infectious stool studies ordered at last visit in 03/21/2024 were not completed though low suspicion at this time for infectious etiology, given chronicity. Could be dealing with IBS, celiac disease, alpha gal or EPI. I did discuss further testing with the patient though at this time she prefers to hold off as she is feeling okay and only having intermittent looser stools, no alarm symptoms. She will make me aware if symptoms worsen, she develops new associated symptoms or she wishes to pursue further testing.    PLAN:  -[place] US  for HCC screening -MELD labs, AFP tumor marker -pt to consider further testing of looser stools (pancreatic elastase, fecal fat, alpha gal, celiac panel) she will make me aware of worsening symptoms, new/associated symptoms or desire to pursue further testing  -Reduce salt  intake to <2 g per day - Can take Tylenol max of 2 g per day (650 mg q8h) for pain - Avoid NSAIDs for pain - Avoid eating raw oysters/shellfish - Ensure every night before going to sleep   All questions were answered, patient verbalized understanding and is in agreement with plan as outlined above.    Follow Up: 6 months   Burdette Forehand L. Mariette, MSN, APRN, AGNP-C Adult-Gerontology Nurse Practitioner [facility]  I have reviewed the note and agree with the APP's assessment as described in this progress note  Toribio Fortune, MD Gastroenterology and Hepatology [facility] Gastroenterology

## 2024-09-12 NOTE — Patient Instructions (Signed)
-  we will update remaining labs for your liver and US  -let me know if loose stools worsen or you wish to pursue further testing as we discussed -Reduce salt intake to <2 g per day - Can take Tylenol max of 2 g per day (650 mg q8h) for pain - Avoid NSAIDs for pain - Avoid eating raw oysters/shellfish - protein shake every night before going to sleep to help maintain adequate protein intake  Follow up 6 months  It was a pleasure to see you today. I want to create trusting relationships with patients and provide genuine, compassionate, and quality care. I truly value your feedback! please be on the lookout for a survey regarding your visit with me today. I appreciate your input about our visit and your time in completing this!    Alta Goding L. Kadyn Guild, MSN, APRN, AGNP-C Adult-Gerontology Nurse Practitioner [facility] Gastroenterology at [facility]

## 2024-09-14 LAB — PROTIME-INR
INR: 1
Prothrombin Time: 10.7 s (ref 9.0–11.5)

## 2024-09-14 LAB — AFP TUMOR MARKER: AFP-Tumor Marker: 3 ng/mL

## 2024-09-15 ENCOUNTER — Ambulatory Visit (INDEPENDENT_AMBULATORY_CARE_PROVIDER_SITE_OTHER): Payer: Self-pay | Admitting: Gastroenterology

## 2024-09-15 ENCOUNTER — Ambulatory Visit (HOSPITAL_COMMUNITY)

## 2024-09-22 ENCOUNTER — Ambulatory Visit (HOSPITAL_COMMUNITY)

## 2024-09-28 ENCOUNTER — Ambulatory Visit (HOSPITAL_COMMUNITY)
Admission: RE | Admit: 2024-09-28 | Discharge: 2024-09-28 | Disposition: A | Discharge status: Home / Self Care | Source: Ambulatory Visit | Attending: Gastroenterology | Admitting: Gastroenterology

## 2024-09-28 DIAGNOSIS — K8020 Calculus of gallbladder without cholecystitis without obstruction: Secondary | ICD-10-CM | POA: Diagnosis not present

## 2024-09-28 DIAGNOSIS — K7469 Other cirrhosis of liver: Secondary | ICD-10-CM | POA: Diagnosis not present

## 2024-09-28 DIAGNOSIS — K7460 Unspecified cirrhosis of liver: Secondary | ICD-10-CM | POA: Diagnosis not present

## 2024-09-28 DIAGNOSIS — I10 Essential (primary) hypertension: Secondary | ICD-10-CM | POA: Diagnosis not present
